# Patient Record
Sex: Male | Born: 1965
Health system: Southern US, Community
[De-identification: ages and names within clinical notes are randomized; demographics above are authoritative.]

## PROBLEM LIST (undated history)

## (undated) DIAGNOSIS — M549 Dorsalgia, unspecified: Secondary | ICD-10-CM

## (undated) DIAGNOSIS — I10 Essential (primary) hypertension: Secondary | ICD-10-CM

## (undated) DIAGNOSIS — M1610 Unilateral primary osteoarthritis, unspecified hip: Secondary | ICD-10-CM

## (undated) DIAGNOSIS — I509 Heart failure, unspecified: Secondary | ICD-10-CM

## (undated) HISTORY — PX: APPENDECTOMY: SHX54

## (undated) HISTORY — DX: Dorsalgia, unspecified: M54.9

## (undated) HISTORY — DX: Unilateral primary osteoarthritis, unspecified hip: M16.10

---

## 2015-10-04 DIAGNOSIS — F419 Anxiety disorder, unspecified: Secondary | ICD-10-CM

## 2015-10-04 DIAGNOSIS — F5105 Insomnia due to other mental disorder: Principal | ICD-10-CM

## 2015-10-04 NOTE — Congregational Nurse Program (Unsigned)
Client came to see mental health CN for complaint of insomnia related to anxiety.  Client states, "My mom is in the hospital and I worry about her and I can't get to sleep."  Client denies suicidal ideation or plan.  Client has been out of prison for a year and has taken anger management classes.  Client has a health care provider [facility].  Client does not know the name of the provider.  Client given the address and phone number for clinic and address, also given phone number and address for [facility] to help with the grieving process.

## 2017-03-19 ENCOUNTER — Emergency Department (HOSPITAL_COMMUNITY)
Admission: EM | Admit: 2017-03-19 | Discharge: 2017-03-20 | Disposition: A | Discharge status: Home / Self Care | Payer: Self-pay | Attending: Emergency Medicine | Admitting: Emergency Medicine

## 2017-03-19 ENCOUNTER — Encounter (HOSPITAL_COMMUNITY): Payer: Self-pay | Admitting: *Deleted

## 2017-03-19 DIAGNOSIS — M549 Dorsalgia, unspecified: Secondary | ICD-10-CM | POA: Insufficient documentation

## 2017-03-19 DIAGNOSIS — I10 Essential (primary) hypertension: Secondary | ICD-10-CM | POA: Insufficient documentation

## 2017-03-19 DIAGNOSIS — R1032 Left lower quadrant pain: Secondary | ICD-10-CM | POA: Insufficient documentation

## 2017-03-19 HISTORY — DX: Essential (primary) hypertension: I10

## 2017-03-19 LAB — COMPREHENSIVE METABOLIC PANEL
ALT: 43 U/L (ref 17–63)
ANION GAP: 14 (ref 5–15)
AST: 46 U/L — ABNORMAL HIGH (ref 15–41)
Albumin: 4.4 g/dL (ref 3.5–5.0)
Alkaline Phosphatase: 79 U/L (ref 38–126)
BUN: 12 mg/dL (ref 6–20)
CALCIUM: 8.9 mg/dL (ref 8.9–10.3)
CHLORIDE: 103 mmol/L (ref 101–111)
CO2: 21 mmol/L — AB (ref 22–32)
Creatinine, Ser: 0.79 mg/dL (ref 0.61–1.24)
GFR calc non Af Amer: >60 mL/min (ref >60)
Glucose, Bld: 89 mg/dL (ref 65–99)
Potassium: 3.6 mmol/L (ref 3.5–5.1)
SODIUM: 138 mmol/L (ref 135–145)
Total Bilirubin: 1.1 mg/dL (ref 0.3–1.2)
Total Protein: 8.3 g/dL — ABNORMAL HIGH (ref 6.5–8.1)

## 2017-03-19 LAB — CBC
HCT: 44.6 % (ref 39.0–52.0)
HEMOGLOBIN: 14.4 g/dL (ref 13.0–17.0)
MCH: 24.9 pg — AB (ref 26.0–34.0)
MCHC: 32.3 g/dL (ref 30.0–36.0)
MCV: 77.2 fL — AB (ref 78.0–100.0)
Platelets: 183 10*3/uL (ref 150–400)
RBC: 5.78 MIL/uL (ref 4.22–5.81)
RDW: 13.1 % (ref 11.5–15.5)
WBC: 8.6 10*3/uL (ref 4.0–10.5)

## 2017-03-19 LAB — URINALYSIS, ROUTINE W REFLEX MICROSCOPIC
Bilirubin Urine: NEGATIVE
Glucose, UA: NEGATIVE mg/dL
HGB URINE DIPSTICK: NEGATIVE
Ketones, ur: NEGATIVE mg/dL
LEUKOCYTES UA: NEGATIVE
Nitrite: NEGATIVE
Protein, ur: NEGATIVE mg/dL
SPECIFIC GRAVITY, URINE: 1.004 — AB (ref 1.005–1.030)
pH: 5 (ref 5.0–8.0)

## 2017-03-19 LAB — LIPASE, BLOOD: LIPASE: 68 U/L — AB (ref 11–51)

## 2017-03-19 NOTE — ED Triage Notes (Signed)
Per EMS, pt complains of LLQ abdominal pain, radiating to bladder, increased urinary frequency. Pt is also having difficulty raising left leg. Pt denies n/v/d.   BP 170/110 HR 104 O2 96% CBG 94

## 2017-03-19 NOTE — ED Provider Notes (Signed)
[facility]-EMERGENCY DEPT Provider Note   CSN: [id number] Arrival date & time: 03/19/17  1311     History   Chief Complaint Chief Complaint  Patient presents with  . Abdominal Pain    HPI Jerry Jennings is a 51 y.o. male with history of hypertension and appendectomy presents for evaluation of left lower quadrant abdominal pain described as intermittent, sharp and "pulling" for the last 2-3 months. Pain begins at anterior/lateral left thigh and radiates into left groin and LLQ.  Pain is worse with walking, bending at the waist, lifting his left leg. States he has physically lift his left leg up with his hands when getting in and out of bed. Also reports midline low back pain, feels stiff. Describes pain in the left lower extremity (thigh) as burning and shooting into left groin. States he is unable to squat and lean forward and pick up boxes without pain. States flexion of left hip causes pain in anterior thigh, but further bending all the way up causes sharp pain to LLQ. No falls or previous injury to back. Reports riding his mountain bike daily to and from work for the last 2 years, stopped 3-4 months ago due to pain. He works at [employer]. Has tried Tylenol which provides mild relief.  No fevers, chills, nausea, vomiting, dysuria, hematuria, diarrhea, constipation, testicular pain, saddle anesthesia. No IV drug use. No history of sciatica.   HPI  Past Medical History:  Diagnosis Date  . Hypertension     There are no active problems to display for this patient.   Past Surgical History:  Procedure Laterality Date  . APPENDECTOMY         Home Medications    Prior to Admission medications   Medication Sig Start Date End Date Taking? Authorizing Provider  acetaminophen (TYLENOL) 500 MG tablet Take 500 mg by mouth every 6 (six) hours as needed for mild pain, moderate pain or headache.   Yes [provider]    Family History No  family history on file.  Social History Social History   Tobacco Use  . Smoking status: Never Smoker  . Smokeless tobacco: Never Used  Substance Use Topics  . Alcohol use: Yes    Frequency: Never  . Drug use: No     Allergies   Sulfa antibiotics   Review of Systems Review of Systems  Gastrointestinal: Positive for abdominal pain.  Musculoskeletal: Positive for arthralgias, back pain, gait problem and myalgias.  Neurological: Positive for numbness (intermittent).  All other systems reviewed and are negative.    Physical Exam Updated Vital Signs BP (!) 183/73 (BP Location: Left Arm)   Pulse 91   Temp 98.5 F (36.9 C) (Oral)   Resp 16   Ht 6\' 1"  (1.854 m)   Wt 124.7 kg (275 lb)   SpO2 98%   BMI 36.28 kg/m   Physical Exam  Constitutional: He appears well-developed and well-nourished. No distress.  HENT:  Head: Normocephalic and atraumatic.  Nose: Nose normal.  Eyes: EOM are normal.  Neck:  No midline cervical spine tenderness No cervical paraspinal muscular tenderness or increased tone Full AROM of cervical spine without pain or rigidity   Cardiovascular: Normal rate, S1 normal, S2 normal and normal heart sounds.  Pulses:      Radial pulses are 2+ on the right side, and 2+ on the left side.       Dorsalis pedis pulses are 2+ on the right side, and 2+  on the left side.  Pulmonary/Chest: Effort normal and breath sounds normal. He has no decreased breath sounds. He exhibits no tenderness.  Abdominal: Soft. Normal appearance and bowel sounds are normal. There is tenderness in the left lower quadrant.    Focal tenderness to LLQ and left groin. No obvious inguinal hernias or masses.   Genitourinary:  Genitourinary Comments: External genitalia normal without erythema, edema, tenderness or lesions.  No groin lymphadenopathy.  No meatus discharge.  Glans and shaft smooth without tenderness, lesions, masses or deformity. Scrotum without lesions or edema.  Non tender  testicles. Epididymis and spermatic cord without tenderness or masses, bilaterally.  Musculoskeletal: He exhibits tenderness.       Right shoulder: He exhibits tenderness.       Lumbar back: He exhibits tenderness and pain.  +Midline lumbar spine tenderness w/o step offs or crepitus. No bilateral lumbar muscular tenderness. +Pain with PROM of left hip in anterior groin/LLQ SI joints and sciatic notches non tender, bilaterally  +Left SLR causes pain in left anterior groin/upper leg Did not tolerate Pearlean BrownieFaber.   Neurological:  5/5 strength with flexion/extension of hip, knee and ankle, bilaterally.  Sensation to light touch intact in lower extremities including feet  Skin: Skin is warm and dry. Capillary refill takes less than 2 seconds.  Psychiatric: He has a normal mood and affect. His behavior is normal. Judgment and thought content normal.     ED Treatments / Results  Labs (all labs ordered are listed, but only abnormal results are displayed) Labs Reviewed  LIPASE, BLOOD - Abnormal; Notable for the following components:      Result Value   Lipase 68 (*)    All other components within normal limits  COMPREHENSIVE METABOLIC PANEL - Abnormal; Notable for the following components:   CO2 21 (*)    Total Protein 8.3 (*)    AST 46 (*)    All other components within normal limits  CBC - Abnormal; Notable for the following components:   MCV 77.2 (*)    MCH 24.9 (*)    All other components within normal limits  URINALYSIS, ROUTINE W REFLEX MICROSCOPIC - Abnormal; Notable for the following components:   Color, Urine STRAW (*)    Specific Gravity, Urine 1.004 (*)    All other components within normal limits    EKG  EKG Interpretation None       Radiology No results found.  Procedures Procedures (including critical care time)  Medications Ordered in ED Medications  iopamidol (ISOVUE-300) 61 % injection (not administered)  iopamidol (ISOVUE-300) 61 % injection 100 mL (100 mLs  Intravenous Contrast Given 03/20/17 0034)     Initial Impression / Assessment and Plan / ED Course  I have reviewed the triage vital signs and the nursing notes.  Pertinent labs & imaging results that were available during my care of the patient were reviewed by me and considered in my medical decision making (see chart for details).    51 yo male presents to ED for evaluation of LLQ pain. Tight, pulling, intermittent. Pain actually started in left anterior upper thigh and radiates to LLQ. Ongoing for 2-3 months. No trauma or falls. No h/o DVT/PE.   On exam, extremity is NVI. No LE edema. He has focal tenderness to LLQ and left groin. He has pain to left anterior thigh and groin with PROM of hip and flexion against resistance. No overlaying skin changes. No obvious inguinal hernia. He also has midline lumbar spine tenderness.  Unclear etiology of symptoms. Considering abdominal vs MSk vs pinched nerve. Less likely thrombus.   Final Clinical Impressions(s) / ED Diagnoses   Lab work unremarkable. He is hypertensive, but stable. He is in NAD. Will order CT A/P, hip and lumbar spine.  Discussed plan with supervising physician who is agreeable with plan. Pt will be handed off to oncoming EDPA Humes who will f/u with imaging and determine disposition.   [id number]: re-evaluated pt. He returned from CT. He is hungry and requesting food. Pain is tolerable, not requesting pain control. CT scans pending. Anticipate d/c if no emergent process on CT scans with pain control. Discussed plan with pt who is agreeable. He will be referred to appropriate outpatient provider based on CT results. He has no PCP.  Final diagnoses:  Back ache  Left lower quadrant pain  Left groin pain    ED Discharge Orders    None       Jerrell MylarGibbons, Somara Frymire J, PA-C 03/20/17 0056    Nira Connardama, Pedro Eduardo, MD 03/20/17 0130

## 2017-03-20 ENCOUNTER — Emergency Department (HOSPITAL_COMMUNITY): Payer: Self-pay

## 2017-03-20 ENCOUNTER — Other Ambulatory Visit: Payer: Self-pay

## 2017-03-20 MED ORDER — NAPROXEN 500 MG PO TABS: 500.0000 mg | ORAL_TABLET | Freq: Two times a day (BID) | ORAL | 0 refills | Status: DC | PRN

## 2017-03-20 MED ORDER — HYDROCHLOROTHIAZIDE 25 MG PO TABS: 25.0000 mg | ORAL_TABLET | Freq: Every day | ORAL | 0 refills | Status: DC

## 2017-03-20 MED ORDER — HYDROCHLOROTHIAZIDE 12.5 MG PO CAPS
25.0000 mg | ORAL_CAPSULE | Freq: Once | ORAL | Status: AC
  Administered 2017-03-20: 25 mg via ORAL
  Filled 2017-03-20: qty 2

## 2017-03-20 MED ORDER — ATENOLOL 50 MG PO TABS: 50.0000 mg | ORAL_TABLET | Freq: Every day | ORAL | 0 refills | Status: DC

## 2017-03-20 MED ORDER — IOPAMIDOL (ISOVUE-300) INJECTION 61%
INTRAVENOUS | Status: AC
  Filled 2017-03-20: qty 100

## 2017-03-20 MED ORDER — IOPAMIDOL (ISOVUE-300) INJECTION 61%
100.0000 mL | Freq: Once | INTRAVENOUS | Status: AC | PRN
  Administered 2017-03-20: 100 mL via INTRAVENOUS

## 2017-03-20 MED ORDER — ATENOLOL 50 MG PO TABS
50.0000 mg | ORAL_TABLET | Freq: Once | ORAL | Status: AC
  Administered 2017-03-20: 50 mg via ORAL
  Filled 2017-03-20: qty 1

## 2017-03-20 NOTE — ED Provider Notes (Signed)
3:00 AM Patient care assumed from Sharen Hecklaudia Gibbons, PA-C at change of shift.  Patient with imaging pending at shift change.  Plan discussed with Bradd CanaryGibbons, PA-C which includes discharge if imaging reassuring.  CTs have been reviewed.  Imaging notable for moderate to severe osteoarthritic change about the hips, left greater than right.  There is also degenerative disc changes at L3 through L5.  Patient with complaints of left thigh and groin pain.  This is most likely attributable to these osteoarthritic changes especially given that they are aggravated with ambulation, bending at the waist, lifting of the left leg.  Have discussed supportive management as well as orthopedic referral.  Patient agreeable to plan.  He expresses concern about hypertension which he has a history of.  He states that he has been off of his atenolol and hydrochlorothiazide for the past 4 months secondary to cost restriction.  These have been prescribed to him today and patient has been reassured that these medications [facility] $4 each for 1 month supply.  I believe the patient is stable for discharge and further follow-up on an outpatient basis.  Return precautions provided at discharge.  Patient agreeable to plan with no unaddressed concerns.   Results for orders placed or performed during the hospital encounter of 03/19/17  Lipase, blood  Result Value Ref Range   Lipase 68 (H) 11 - 51 U/L  Comprehensive metabolic panel  Result Value Ref Range   Sodium 138 135 - 145 mmol/L   Potassium 3.6 3.5 - 5.1 mmol/L   Chloride 103 101 - 111 mmol/L   CO2 21 (L) 22 - 32 mmol/L   Glucose, Bld 89 65 - 99 mg/dL   BUN 12 6 - 20 mg/dL   Creatinine, Ser 1.610.79 0.61 - 1.24 mg/dL   Calcium 8.9 8.9 - 09.610.3 mg/dL   Total Protein 8.3 (H) 6.5 - 8.1 g/dL   Albumin 4.4 3.5 - 5.0 g/dL   AST 46 (H) 15 - 41 U/L   ALT 43 17 - 63 U/L   Alkaline Phosphatase 79 38 - 126 U/L   Total Bilirubin 1.1 0.3 - 1.2 mg/dL   GFR calc non Af  Amer >60 >60 mL/min   GFR calc Af Amer >60 >60 mL/min   Anion gap 14 5 - 15  CBC  Result Value Ref Range   WBC 8.6 4.0 - 10.5 K/uL   RBC 5.78 4.22 - 5.81 MIL/uL   Hemoglobin 14.4 13.0 - 17.0 g/dL   HCT 04.544.6 40.939.0 - 81.152.0 %   MCV 77.2 (L) 78.0 - 100.0 fL   MCH 24.9 (L) 26.0 - 34.0 pg   MCHC 32.3 30.0 - 36.0 g/dL   RDW 91.413.1 78.211.5 - 95.615.5 %   Platelets 183 150 - 400 K/uL  Urinalysis, Routine w reflex microscopic  Result Value Ref Range   Color, Urine STRAW (A) YELLOW   APPearance CLEAR CLEAR   Specific Gravity, Urine 1.004 (L) 1.005 - 1.030   pH 5.0 5.0 - 8.0   Glucose, UA NEGATIVE NEGATIVE mg/dL   Hgb urine dipstick NEGATIVE NEGATIVE   Bilirubin Urine NEGATIVE NEGATIVE   Ketones, ur NEGATIVE NEGATIVE mg/dL   Protein, ur NEGATIVE NEGATIVE mg/dL   Nitrite NEGATIVE NEGATIVE   Leukocytes, UA NEGATIVE NEGATIVE   Ct Abdomen Pelvis W Contrast  Result Date: 03/20/2017 CLINICAL DATA:  Initial evaluation for acute left lower quadrant abdominal pain radiating to bladder, increased urinary frequency. Also with difficulty raising left leg. EXAM:  CT ABDOMEN AND PELVIS WITH CONTRAST CT LUMBAR SPINE WITHOUT CONTRAST TECHNIQUE: Multidetector CT imaging of the abdomen and pelvis was performed using the standard protocol following bolus administration of intravenous contrast. Additional re-formatted imaging of the lumbar spine was performed. CONTRAST:  ISOVUE-300 IOPAMIDOL (ISOVUE-300) INJECTION 61% COMPARISON:  None. FINDINGS: Lower chest: Mild hazy subsegmental atelectatic changes noted within the lung bases. Visualized lungs are otherwise clear. Hepatobiliary: Hepatic steatosis. Liver otherwise unremarkable. Gallbladder contracted. Scattered internal calcifications likely reflects small stones. No imaging findings to suggest acute cholecystitis. No biliary dilatation. Pancreas: Pancreas there are 2 adjacent ovoid soft tissue lesions measuring 3.7 cm and 3.3 cm positioned just superior to the  pancreatic body (series 2, image 28). These lesions are favored to arise from the pancreas inferiorly, although exact delineation somewhat difficult on this exam. These appear separate from knee stomach superiorly. Findings are indeterminate. Pancreas otherwise unremarkable. No peripancreatic inflammation or abnormal pancreatic ductal dilatation. Spleen: Spleen within normal limits. Adrenals/Urinary Tract: Adrenal glands are normal. Kidneys equal in size with symmetric enhancement. 2.5 cm cyst noted at the upper pole of the left kidney. No nephrolithiasis hydronephrosis, or focal enhancing renal mass. No hydroureter. Partially distended bladder within normal limits. Stomach/Bowel: Small hiatal hernia noted. Stomach otherwise unremarkable. No evidence for bowel obstruction. Appendix is normal. No acute inflammatory changes seen about the bowels. Vascular/Lymphatic: Normal intravascular enhancement seen throughout the intra-abdominal aorta. No aneurysm. Mesenteric vessels patent proximally. No adenopathy. Reproductive: Prostate normal. Other: Cysts no free air or fluid. Small fat containing paraumbilical hernia noted. Musculoskeletal: No acute osseus abnormality. No worrisome lytic or blastic osseous lesions. Severe left with moderate right osteoarthritic changes about the hips. CT LUMBAR SPINE FINDINGS: Normal segmentation. Lowest well-formed disc labeled the L5-S1 level. Trace anterolisthesis of L4 on L5. Vertebral bodies otherwise normally aligned with preservation of the normal lumbar lordosis. Vertebral body heights maintained without evidence for acute or chronic fracture. No discrete lytic or blastic osseous lesions. Visualized sacrum and pelvis are intact. Paraspinous soft tissues demonstrate no acute abnormality. 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 L1-2:  Mild facet hypertrophy.  No stenosis. L2-3: Mild diffuse disc bulge. Moderate facet  hypertrophy. No significant canal stenosis. Mild bilateral L2 foraminal narrowing. L3-4: Diffuse disc bulge. Moderate facet hypertrophy. Resultant mild to moderate spinal stenosis. Fairly severe bilateral L3 foraminal narrowing. L4-5: Diffuse disc bulge. Moderate facet arthrosis, right worse than left. Mild to moderate canal with bilateral lateral recess narrowing. Moderate to severe bilateral L4 foraminal stenosis. L5-S1: Diffuse disc bulge. Advanced facet arthrosis. Bulging disc closely approximates the descending S1 nerve roots without frank impingement. No significant spinal stenosis. Moderate right with moderate to severe left L5 foraminal narrowing. IMPRESSION: CT ABDOMEN AND PELVIS IMPRESSION: 1. No CT evidence for acute intra-abdominal or pelvic process. 2. Two adjacent well-circumscribed soft tissue lesions measuring up to 3.7 cm arising from the pancreatic body, indeterminate. Follow-up examination with dedicated MRI of the abdomen, with and without contrast, recommended for further characterization. 3. Cholelithiasis. 4. Hepatic steatosis. 5. Moderate to severe osteoarthritic changes about the hips, left greater than right. CT LUMBAR SPINE IMPRESSION: 1. No acute abnormality within the lumbar spine. 2. Degenerative disc bulging with facet arthropathy at L3-4 and L4-5 with resultant mild to moderate spinal stenosis. 3. Multifactorial degenerative changes with resultant moderate to severe bilateral foraminal stenosis at L3 through L5 as above. 4. 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 Electronically Signed   By: Rise Mu M.D.   On:  03/20/2017 01:51   Ct Hip Left Wo Contrast  Result Date: 03/20/2017 CLINICAL DATA:  Difficulty raising left leg. EXAM: CT OF THE LEFT HIP WITHOUT CONTRAST TECHNIQUE: Multidetector CT imaging of the left hip was performed according to the standard protocol. Multiplanar CT image reconstructions were also generated. COMPARISON:  Performed in conjunction  with CT of the abdomen and pelvis and CT of the lumbar spine, reported separately. FINDINGS: Bones/Joint/Cartilage Advanced osteoarthritis with near complete joint space loss and large subchondral cysts. Large acetabular and femoral head osteophytes, acetabular osteophytes are fragmented. No large hip joint effusion. No acute fracture. Ligaments Suboptimally assessed by CT. Muscles and Tendons No intramuscular collection or evidence of acute abnormality. Soft tissues Large body habitus.  No suspicious subcutaneous abnormality. IMPRESSION: Advanced osteoarthritis of the left hip with significant joint space loss, large subchondral cysts and osteophytes. No acute osseous abnormality. Electronically Signed   By: Rubye OaksMelanie  Ehinger M.D.   On: 03/20/2017 01:32   Ct L-spine No Charge  Result Date: 03/20/2017 CLINICAL DATA:  Initial evaluation for acute left lower quadrant abdominal pain radiating to bladder, increased urinary frequency. Also with difficulty raising left leg. EXAM: CT ABDOMEN AND PELVIS WITH CONTRAST CT LUMBAR SPINE WITHOUT CONTRAST TECHNIQUE: Multidetector CT imaging of the abdomen and pelvis was performed using the standard protocol following bolus administration of intravenous contrast. Additional re-formatted imaging of the lumbar spine was performed. CONTRAST:  100mL ISOVUE-300 IOPAMIDOL (ISOVUE-300) INJECTION 61% COMPARISON:  None. FINDINGS: Lower chest: Mild hazy subsegmental atelectatic changes noted within the lung bases. Visualized lungs are otherwise clear. Hepatobiliary: Hepatic steatosis. Liver otherwise unremarkable. Gallbladder contracted. Scattered internal calcifications likely reflects small stones. No imaging findings to suggest acute cholecystitis. No biliary dilatation. Pancreas: Pancreas there are 2 adjacent ovoid soft tissue lesions measuring 3.7 cm and 3.3 cm positioned just superior to the pancreatic body (series 2, image 28). These lesions are favored to arise from the pancreas  inferiorly, although exact delineation somewhat difficult on this exam. These appear separate from knee stomach superiorly. Findings are indeterminate. Pancreas otherwise unremarkable. No peripancreatic inflammation or abnormal pancreatic ductal dilatation. Spleen: Spleen within normal limits. Adrenals/Urinary Tract: Adrenal glands are normal. Kidneys equal in size with symmetric enhancement. 2.5 cm cyst noted at the upper pole of the left kidney. No nephrolithiasis hydronephrosis, or focal enhancing renal mass. No hydroureter. Partially distended bladder within normal limits. Stomach/Bowel: Small hiatal hernia noted. Stomach otherwise unremarkable. No evidence for bowel obstruction. Appendix is normal. No acute inflammatory changes seen about the bowels. Vascular/Lymphatic: Normal intravascular enhancement seen throughout the intra-abdominal aorta. No aneurysm. Mesenteric vessels patent proximally. No adenopathy. Reproductive: Prostate normal. Other: Cysts no free air or fluid. Small fat containing paraumbilical hernia noted. Musculoskeletal: No acute osseus abnormality. No worrisome lytic or blastic osseous lesions. Severe left with moderate right osteoarthritic changes about the hips. CT LUMBAR SPINE FINDINGS: Normal segmentation. Lowest well-formed disc labeled the L5-S1 level. Trace anterolisthesis of L4 on L5. Vertebral bodies otherwise normally aligned with preservation of the normal lumbar lordosis. Vertebral body heights maintained without evidence for acute or chronic fracture. No discrete lytic or blastic osseous lesions. Visualized sacrum and pelvis are intact. Paraspinous soft tissues demonstrate no acute abnormality. 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 L1-2:  Mild facet hypertrophy.  No stenosis. L2-3: Mild diffuse disc bulge. Moderate facet hypertrophy. No significant canal stenosis. Mild bilateral L2 foraminal narrowing. L3-4: Diffuse  disc bulge. Moderate facet hypertrophy. Resultant mild to moderate spinal stenosis. Fairly  severe bilateral L3 foraminal narrowing. L4-5: Diffuse disc bulge. Moderate facet arthrosis, right worse than left. Mild to moderate canal with bilateral lateral recess narrowing. Moderate to severe bilateral L4 foraminal stenosis. L5-S1: Diffuse disc bulge. Advanced facet arthrosis. Bulging disc closely approximates the descending S1 nerve roots without frank impingement. No significant spinal stenosis. Moderate right with moderate to severe left L5 foraminal narrowing. IMPRESSION: CT ABDOMEN AND PELVIS IMPRESSION: 1. No CT evidence for acute intra-abdominal or pelvic process. 2. Two adjacent well-circumscribed soft tissue lesions measuring up to 3.7 cm arising from the pancreatic body, indeterminate. Follow-up examination with dedicated MRI of the abdomen, with and without contrast, recommended for further characterization. 3. Cholelithiasis. 4. Hepatic steatosis. 5. Moderate to severe osteoarthritic changes about the hips, left greater than right. CT LUMBAR SPINE IMPRESSION: 1. No acute abnormality within the lumbar spine. 2. Degenerative disc bulging with facet arthropathy at L3-4 and L4-5 with resultant mild to moderate spinal stenosis. 3. Multifactorial degenerative changes with resultant moderate to severe bilateral foraminal stenosis at L3 through L5 as above. 4. 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 Electronically Signed   By: Rise Mu M.D.   On: 03/20/2017 01:51      Antony Madura, PA-C 03/20/17 1610    Azalia Bilis, MD 03/21/17 [phone]

## 2018-12-06 ENCOUNTER — Emergency Department (HOSPITAL_COMMUNITY)
Admission: EM | Admit: 2018-12-06 | Discharge: 2018-12-06 | Disposition: A | Discharge status: Home / Self Care | Payer: Self-pay | Attending: Emergency Medicine | Admitting: Emergency Medicine

## 2018-12-06 ENCOUNTER — Encounter (HOSPITAL_COMMUNITY): Payer: Self-pay | Admitting: Emergency Medicine

## 2018-12-06 ENCOUNTER — Emergency Department (HOSPITAL_COMMUNITY): Payer: Self-pay

## 2018-12-06 ENCOUNTER — Other Ambulatory Visit: Payer: Self-pay

## 2018-12-06 DIAGNOSIS — M1612 Unilateral primary osteoarthritis, left hip: Secondary | ICD-10-CM | POA: Insufficient documentation

## 2018-12-06 DIAGNOSIS — I10 Essential (primary) hypertension: Secondary | ICD-10-CM | POA: Insufficient documentation

## 2018-12-06 DIAGNOSIS — Z79899 Other long term (current) drug therapy: Secondary | ICD-10-CM | POA: Insufficient documentation

## 2018-12-06 MED ORDER — HYDROCODONE-ACETAMINOPHEN 5-325 MG PO TABS: 1.0000 | ORAL_TABLET | Freq: Four times a day (QID) | ORAL | 0 refills | Status: DC | PRN

## 2018-12-06 MED ORDER — ATENOLOL 50 MG PO TABS
50.0000 mg | ORAL_TABLET | Freq: Once | ORAL | Status: AC
  Administered 2018-12-06: 50 mg via ORAL
  Filled 2018-12-06: qty 1

## 2018-12-06 MED ORDER — HYDROCODONE-ACETAMINOPHEN 5-325 MG PO TABS
1.0000 | ORAL_TABLET | Freq: Once | ORAL | Status: AC
  Administered 2018-12-06: 1 via ORAL
  Filled 2018-12-06: qty 1

## 2018-12-06 MED ORDER — ATENOLOL 50 MG PO TABS: 50.0000 mg | ORAL_TABLET | Freq: Every day | ORAL | 0 refills | Status: DC

## 2018-12-06 MED ORDER — HYDROCODONE-ACETAMINOPHEN 5-325 MG PO TABS
1.0000 | ORAL_TABLET | ORAL | Status: AC
  Administered 2018-12-06: 1 via ORAL
  Filled 2018-12-06: qty 1

## 2018-12-06 NOTE — ED Triage Notes (Signed)
[place] [facility] [place] pains for months but got worse in the past 21 days. denies falls or injuries.  Vitals: 218/90 out of HTN meds

## 2018-12-06 NOTE — ED Provider Notes (Signed)
[facility] DEPT Provider Note   CSN: [id number] Arrival date & time: 12/06/18  1830     History   Chief Complaint Chief Complaint  Patient presents with  . Hip Pain    left    HPI Jerry Jennings is a 53 y.o. male.     HPI Patient presents to the emergency room for evaluation of left hip pain.  Patient states he started having pain in his left hip several months ago.  Over the last week or 2 the symptoms have been getting worse.  He finds it very painful to walk.  He needs to use a cane.  He finds that he is having difficulty lifting his leg.  He feels the pain shoots up into his hip.  He denies any back pain.  He denies any abdominal pain.  No numbness.  No difficulty urinating.  Patient also has history of hypertension but has not had any of his medications in months. Past Medical History:  Diagnosis Date  . Hypertension     There are no active problems to display for this patient.   Past Surgical History:  Procedure Laterality Date  . APPENDECTOMY          Home Medications    Prior to Admission medications   Medication Sig Start Date End Date Taking? Authorizing Provider  acetaminophen (TYLENOL) 500 MG tablet Take 500-1,000 mg by mouth every 6 (six) hours as needed.   Yes [provider]  hydrochlorothiazide (HYDRODIURIL) 25 MG tablet Take 1 tablet (25 mg total) by mouth daily. 03/20/17  Yes Antonietta Breach, PA-C  ibuprofen (ADVIL) 200 MG tablet Take 600-800 mg by mouth every 6 (six) hours as needed for mild pain or moderate pain.   Yes [provider]  atenolol (TENORMIN) 50 MG tablet Take 1 tablet (50 mg total) by mouth daily. 12/06/18   Dorie Rank, MD  HYDROcodone-acetaminophen (NORCO/VICODIN) 5-325 MG tablet Take 1 tablet by mouth every 6 (six) hours as needed. 12/06/18   Dorie Rank, MD  naproxen (NAPROSYN) 500 MG tablet Take 1 tablet (500 mg total) by mouth every 12 (twelve) hours as needed for mild pain or moderate pain.  Patient not taking: Reported on 12/06/2018 03/20/17   Antonietta Breach, PA-C    Family History No family history on file.  Social History Social History   Tobacco Use  . Smoking status: Never Smoker  . Smokeless tobacco: Never Used  Substance Use Topics  . Alcohol use: Yes    Frequency: Never  . Drug use: No     Allergies   Sulfa antibiotics   Review of Systems Review of Systems  All other systems reviewed and are negative.    Physical Exam Updated Vital Signs BP (!) 141/70   Pulse 71   Temp 98.7 F (37.1 C) (Oral)   Resp 15   SpO2 97%   Physical Exam Vitals signs and nursing note reviewed.  Constitutional:      General: He is not in acute distress.    Appearance: He is well-developed.     Comments: Obese  HENT:     Head: Normocephalic and atraumatic.     Right Ear: External ear normal.     Left Ear: External ear normal.  Eyes:     General: No scleral icterus.       Right eye: No discharge.        Left eye: No discharge.     Conjunctiva/sclera: Conjunctivae normal.  Neck:  Musculoskeletal: Neck supple.     Trachea: No tracheal deviation.  Cardiovascular:     Rate and Rhythm: Normal rate and regular rhythm.  Pulmonary:     Effort: Pulmonary effort is normal. No respiratory distress.     Breath sounds: Normal breath sounds. No stridor. No wheezing or rales.  Abdominal:     General: Bowel sounds are normal. There is no distension.     Palpations: Abdomen is soft.     Tenderness: There is no abdominal tenderness. There is no guarding or rebound.  Musculoskeletal:     Left hip: He exhibits tenderness and bony tenderness.     Lumbar back: He exhibits no bony tenderness and no swelling.     Comments: Tenderness palpation left hip region, pain with range of motion, patient has difficulty lifting his left leg off the bed, 5 out of 5 plantar flexion dorsiflexion strength, sensation normal  Skin:    General: Skin is warm and dry.     Findings: No rash.   Neurological:     Mental Status: He is alert.     Cranial Nerves: No cranial nerve deficit (no facial droop, extraocular movements intact, no slurred speech).     Sensory: No sensory deficit.     Motor: No abnormal muscle tone or seizure activity.     Coordination: Coordination normal.      ED Treatments / Results  Labs (all labs ordered are listed, but only abnormal results are displayed) Labs Reviewed - No data to display  EKG None  Radiology Dg Lumbar Spine Complete  Result Date: 12/06/2018 CLINICAL DATA:  Pain EXAM: LUMBAR SPINE - COMPLETE 4+ VIEW COMPARISON:  CT dated March 20, 2017. FINDINGS: There is multilevel disc height loss throughout the lumbar spine. There is multilevel facet arthrosis, greatest at the lower lumbar segments. There is no displaced fracture. No dislocation. End-stage degenerative changes of the left hip are noted. IMPRESSION: 1. No displaced fracture. 2. Multilevel degenerative changes are noted throughout the lumbar spine. 3. End-stage degenerative changes of the left hip. Electronically Signed   By: Katherine Mantlehristopher  Green M.D.   On: 12/06/2018 21:54   Dg Hip Unilat W Or Wo Pelvis 2-3 Views Left  Result Date: 12/06/2018 CLINICAL DATA:  Hip pain EXAM: DG HIP (WITH OR WITHOUT PELVIS) 2-3V LEFT COMPARISON:  CT dated March 20, 2017. FINDINGS: There is no acute displaced fracture or dislocation. There are end-stage degenerative changes of the left hip. There are moderate degenerative changes of the right hip. IMPRESSION: 1. No acute displaced fracture or dislocation. 2. End-stage degenerative changes of the left hip, significantly progressed since 2018 CT. Electronically Signed   By: Katherine Mantlehristopher  Green M.D.   On: 12/06/2018 21:52    Procedures Procedures (including critical care time)  Medications Ordered in ED Medications  HYDROcodone-acetaminophen (NORCO/VICODIN) 5-325 MG per tablet 1 tablet (1 tablet Oral Given 12/06/18 2145)  atenolol (TENORMIN) tablet 50  mg (50 mg Oral Given 12/06/18 2145)     Initial Impression / Assessment and Plan / ED Course  I have reviewed the triage vital signs and the nursing notes.  Pertinent labs & imaging results that were available during my care of the patient were reviewed by me and considered in my medical decision making (see chart for details).   Patient's x-rays show severe arthritis of his left hip.  He does have degenerative changes in his lumbar spine but I think his pain originates from his hip osteoarthritis.  Patient was given a  dose of pain medications.  He will need to follow-up with an orthopedic doctor.  Explained to him he likely will require hip replacement in order to adequately treat his pain.  I will also give him prescriptions of his antihypertension agents.  Final Clinical Impressions(s) / ED Diagnoses   Final diagnoses:  Osteoarthritis of left hip, unspecified osteoarthritis type    ED Discharge Orders         Ordered    HYDROcodone-acetaminophen (NORCO/VICODIN) 5-325 MG tablet  Every 6 hours PRN     12/06/18 2316    atenolol (TENORMIN) 50 MG tablet  Daily     12/06/18 2317           Linwood Dibbles, MD 12/06/18 2318

## 2018-12-06 NOTE — Discharge Instructions (Signed)
Take the medications as prescribed for pain, follow-up with your orthopedic doctor for further evaluation

## 2018-12-21 ENCOUNTER — Ambulatory Visit: Payer: Self-pay | Admitting: Orthopaedic Surgery

## 2019-02-08 ENCOUNTER — Ambulatory Visit (INDEPENDENT_AMBULATORY_CARE_PROVIDER_SITE_OTHER): Payer: Self-pay | Admitting: Orthopaedic Surgery

## 2019-02-08 ENCOUNTER — Other Ambulatory Visit: Payer: Self-pay

## 2019-02-08 ENCOUNTER — Encounter: Payer: Self-pay | Admitting: Orthopaedic Surgery

## 2019-02-08 VITALS — BP 150/95 | HR 85 | Ht 73.0 in | Wt 310.0 lb

## 2019-02-08 DIAGNOSIS — Z6841 Body Mass Index (BMI) 40.0 and over, adult: Secondary | ICD-10-CM

## 2019-02-08 DIAGNOSIS — M1612 Unilateral primary osteoarthritis, left hip: Secondary | ICD-10-CM

## 2019-02-08 NOTE — Progress Notes (Signed)
Office Visit Note   Patient: Jerry Jennings           Date of Birth: 1965/12/13           MRN: [id number] Visit Date: 02/08/2019              Requested by: Lavinia SharpsPlacey, Mary Ann, NP [address] [place],  [place] [id number] PCP: Lavinia SharpsPlacey, Mary Ann, NP   Assessment & Plan: Visit Diagnoses:  1. Body mass index 40.0-44.9, adult (HCC)   2. Unilateral primary osteoarthritis, left hip     Plan: Patient needs to work on weight loss get his weight down to BMI less than 40 which is 303 pounds at 6 feet 1 inch height.  I will recheck him in 4 weeks we can obtain arthritis panel on return.  He can continue to work at [employer] at [employer].  We discussed once he gets his weight under control we will schedule for total hip arthroplasty direct anterior approach.  We discussed increased risks for complications with increased BMI.  These include infection falling.  Patient negative history for diabetes. The patient meets the AMA guidelines for Morbid (severe) obesity with a BMI > 40.0 and I have recommended weight loss.  Follow-Up Instructions: Return in about 4 weeks (around 03/08/2019).   Orders:  No orders of the defined types were placed in this encounter.  No orders of the defined types were placed in this encounter.     Procedures: No procedures performed   Clinical Data: No additional findings.   Subjective: Chief Complaint  Patient presents with   Left Hip - Pain   Left Leg - Pain    HPI 53 year old male with left hip pain for more than 4 years progressed over the last 8 months and severe in the last 1 month.  He has worked at [employer] doing [employer] work and now has had to transition just to [employer] since he not able to ambulate and is using 2 canes and cannot ambulate without them.  He is lost range of motion of his left hip and now has severe pain with activity great problems getting into a car pain radiates from his left groin down to his left  knee no right hip pain.  He states he has had some problems with sciatica in the past as well.  X-rays emergency room September 2020 showed end-stage severe left hip osteoarthritis with preserved right hip without arthritis.  Lumbar spine x-ray showed some degenerative changes.  Previous CT scan lumbar 03/20/2017 showed some spurring and some mild narrowing with facet arthropathy L3-L5 with some suspicion of some underlying congenital spinal stenosis.  Patient's hip pain is so severe he can barely walk across the room and cannot ambulate enough to know whether he has claudication symptoms.  No associated bowel or bladder symptoms.  Patient's not sure if he is ever had gout before.  Negative history of podagra.  Patient has obesity BMI greater than 40 height 6 feet 1 weight 310.  He states at one point he was 40 to 50 pounds heavier.  Patient is used Tylenol ibuprofen, Naprosyn, Norco, Percocet and states nothing helps the pain.  Patient needs to establish a PCP for evaluation of his overall health.  Blood pressure is mildly elevated at 150/95 today.  Recheck 4 weeks and arthritis panel on return.  Review of Systems positive for severe left hip osteoarthritis.  Negative for diabetes negative for stroke or MI.  Positive  for morbid obesity BMI 40.9.  Cholelithiasis and hepatic steatosis by CT scan abdomen.  Otherwise negative is obtains HPI.   Objective: Vital Signs: BP (!) 150/95    Pulse 85    Ht 6\' 1"  (1.854 m)    Wt (!) 310 lb (140.6 kg)    BMI 40.90 kg/m   Physical Exam Constitutional:      Appearance: He is well-developed.  HENT:     Head: Normocephalic and atraumatic.  Eyes:     Pupils: Pupils are equal, round, and reactive to light.  Neck:     Thyroid: No thyromegaly.     Trachea: No tracheal deviation.  Cardiovascular:     Rate and Rhythm: Normal rate.  Pulmonary:     Effort: Pulmonary effort is normal.     Breath sounds: No wheezing.  Abdominal:     General: Bowel sounds are normal.       Palpations: Abdomen is soft.  Skin:    General: Skin is warm and dry.     Capillary Refill: Capillary refill takes less than 2 seconds.  Neurological:     Mental Status: He is alert and oriented to person, place, and time.  Psychiatric:        Behavior: Behavior normal.        Thought Content: Thought content normal.        Judgment: Judgment normal.     Ortho Exam patient has less than 5 degrees internal and external rotation left hip hip flexion contracture he can not sit and chair with his hip at 90 degrees due to pain.  Good capillary refill palpable posterior tib pulse knee reaches full extension.  Opposite right hip has good hip range of motion.  Specialty Comments:  No specialty comments available.  Imaging: CLINICAL DATA:  Hip pain  EXAM: DG HIP (WITH OR WITHOUT PELVIS) 2-3V LEFT  COMPARISON:  CT dated March 20, 2017.  FINDINGS: There is no acute displaced fracture or dislocation. There are end-stage degenerative changes of the left hip. There are moderate degenerative changes of the right hip.  IMPRESSION: 1. No acute displaced fracture or dislocation. 2. End-stage degenerative changes of the left hip, significantly progressed since 2018 CT.   Electronically Signed   By: 2019 M.D.   On: 12/06/2018 21:52 CLINICAL DATA:  Pain  EXAM: LUMBAR SPINE - COMPLETE 4+ VIEW  COMPARISON:  CT dated March 20, 2017.  FINDINGS: There is multilevel disc height loss throughout the lumbar spine. There is multilevel facet arthrosis, greatest at the lower lumbar segments. There is no displaced fracture. No dislocation. End-stage degenerative changes of the left hip are noted.  IMPRESSION: 1. No displaced fracture. 2. Multilevel degenerative changes are noted throughout the lumbar spine. 3. End-stage degenerative changes of the left hip.   Electronically Signed   By: March 22, 2017 M.D.   On: 12/06/2018 21:54 CLINICAL DATA:   Initial evaluation for acute left lower quadrant abdominal pain radiating to bladder, increased urinary frequency. Also with difficulty raising left leg.  EXAM: CT ABDOMEN AND PELVIS WITH CONTRAST  CT LUMBAR SPINE WITHOUT CONTRAST  TECHNIQUE: Multidetector CT imaging of the abdomen and pelvis was performed using the standard protocol following bolus administration of intravenous contrast. Additional re-formatted imaging of the lumbar spine was performed.  CONTRAST:  02/05/2019 ISOVUE-300 IOPAMIDOL (ISOVUE-300) INJECTION 61%  COMPARISON:  None.  FINDINGS: Lower chest: Mild hazy subsegmental atelectatic changes noted within the lung bases. Visualized lungs are otherwise clear.  Hepatobiliary: Hepatic  steatosis. Liver otherwise unremarkable. Gallbladder contracted. Scattered internal calcifications likely reflects small stones. No imaging findings to suggest acute cholecystitis. No biliary dilatation.  Pancreas: Pancreas there are 2 adjacent ovoid soft tissue lesions measuring 3.7 cm and 3.3 cm positioned just superior to the pancreatic body (series 2, image 28). These lesions are favored to arise from the pancreas inferiorly, although exact delineation somewhat difficult on this exam. These appear separate from knee stomach superiorly. Findings are indeterminate. Pancreas otherwise unremarkable. No peripancreatic inflammation or abnormal pancreatic ductal dilatation.  Spleen: Spleen within normal limits.  Adrenals/Urinary Tract: Adrenal glands are normal. Kidneys equal in size with symmetric enhancement. 2.5 cm cyst noted at the upper pole of the left kidney. No nephrolithiasis hydronephrosis, or focal enhancing renal mass. No hydroureter. Partially distended bladder within normal limits.  Stomach/Bowel: Small hiatal hernia noted. Stomach otherwise unremarkable. No evidence for bowel obstruction. Appendix is normal. No acute inflammatory changes seen about the  bowels.  Vascular/Lymphatic: Normal intravascular enhancement seen throughout the intra-abdominal aorta. No aneurysm. Mesenteric vessels patent proximally. No adenopathy.  Reproductive: Prostate normal.  Other: Cysts no free air or fluid. Small fat containing paraumbilical hernia noted.  Musculoskeletal: No acute osseus abnormality. No worrisome lytic or blastic osseous lesions. Severe left with moderate right osteoarthritic changes about the hips.  CT LUMBAR SPINE FINDINGS:  Normal segmentation. Lowest well-formed disc labeled the L5-S1 level.  Trace anterolisthesis of L4 on L5. Vertebral bodies otherwise normally aligned with preservation of the normal lumbar lordosis.  Vertebral body heights maintained without evidence for acute or chronic fracture. No discrete lytic or blastic osseous lesions. Visualized sacrum and pelvis are intact.  Paraspinous soft tissues demonstrate no acute abnormality.  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  L1-2:  Mild facet hypertrophy.  No stenosis.  L2-3: Mild diffuse disc bulge. Moderate facet hypertrophy. No significant canal stenosis. Mild bilateral L2 foraminal narrowing.  L3-4: Diffuse disc bulge. Moderate facet hypertrophy. Resultant mild to moderate spinal stenosis. Fairly severe bilateral L3 foraminal narrowing.  L4-5: Diffuse disc bulge. Moderate facet arthrosis, right worse than left. Mild to moderate canal with bilateral lateral recess narrowing. Moderate to severe bilateral L4 foraminal stenosis.  L5-S1: Diffuse disc bulge. Advanced facet arthrosis. Bulging disc closely approximates the descending S1 nerve roots without frank impingement. No significant spinal stenosis. Moderate right with moderate to severe left L5 foraminal narrowing.  IMPRESSION: CT ABDOMEN AND PELVIS IMPRESSION:  1. No CT evidence for acute intra-abdominal or pelvic  process. 2. Two adjacent well-circumscribed soft tissue lesions measuring up to 3.7 cm arising from the pancreatic body, indeterminate. Follow-up examination with dedicated MRI of the abdomen, with and without contrast, recommended for further characterization. 3. Cholelithiasis. 4. Hepatic steatosis. 5. Moderate to severe osteoarthritic changes about the hips, left greater than right. CT LUMBAR SPINE IMPRESSION:  1. No acute abnormality within the lumbar spine. 2. Degenerative disc bulging with facet arthropathy at L3-4 and L4-5 with resultant mild to moderate spinal stenosis. 3. Multifactorial degenerative changes with resultant moderate to severe bilateral foraminal stenosis at L3 through L5 as above. 4. 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   Electronically Signed   By: Rise Mu M.D.   On: 03/20/2017 01:51  PMFS History: Patient Active Problem List   Diagnosis Date Noted   Unilateral primary osteoarthritis, left hip 02/09/2019   Past Medical History:  Diagnosis Date   Hypertension     No family history on file.  Past Surgical History:  Procedure  Laterality Date   APPENDECTOMY     Social History   Occupational History   Not on file  Tobacco Use   Smoking status: Never Smoker   Smokeless tobacco: Never Used  Substance and Sexual Activity   Alcohol use: Yes    Frequency: Never   Drug use: No   Sexual activity: Not on file

## 2019-02-09 DIAGNOSIS — M1612 Unilateral primary osteoarthritis, left hip: Secondary | ICD-10-CM | POA: Insufficient documentation

## 2019-03-08 ENCOUNTER — Ambulatory Visit: Payer: Self-pay | Admitting: Orthopaedic Surgery

## 2019-03-10 ENCOUNTER — Ambulatory Visit: Payer: Self-pay | Admitting: Orthopaedic Surgery

## 2019-03-28 ENCOUNTER — Ambulatory Visit: Payer: Self-pay | Admitting: Orthopaedic Surgery

## 2019-04-24 ENCOUNTER — Encounter (HOSPITAL_COMMUNITY): Payer: Self-pay

## 2019-04-24 ENCOUNTER — Ambulatory Visit (HOSPITAL_COMMUNITY)
Admission: EM | Admit: 2019-04-24 | Discharge: 2019-04-24 | Disposition: A | Discharge status: Home / Self Care | Payer: 59 | Attending: Family Medicine | Admitting: Family Medicine

## 2019-04-24 ENCOUNTER — Other Ambulatory Visit: Payer: Self-pay

## 2019-04-24 DIAGNOSIS — R600 Localized edema: Secondary | ICD-10-CM | POA: Diagnosis present

## 2019-04-24 LAB — COMPREHENSIVE METABOLIC PANEL
ALT: 19 U/L (ref 0–44)
AST: 24 U/L (ref 15–41)
Albumin: 3.6 g/dL (ref 3.5–5.0)
Alkaline Phosphatase: 61 U/L (ref 38–126)
Anion gap: 12 (ref 5–15)
BUN: 16 mg/dL (ref 6–20)
CO2: 29 mmol/L (ref 22–32)
Calcium: 9.1 mg/dL (ref 8.9–10.3)
Chloride: 101 mmol/L (ref 98–111)
Creatinine, Ser: 0.76 mg/dL (ref 0.61–1.24)
GFR calc Af Amer: >60 mL/min (ref >60)
GFR calc non Af Amer: >60 mL/min (ref >60)
Glucose, Bld: 97 mg/dL (ref 70–99)
Potassium: 3.3 mmol/L — ABNORMAL LOW (ref 3.5–5.1)
Sodium: 142 mmol/L (ref 135–145)
Total Bilirubin: 0.7 mg/dL (ref 0.3–1.2)
Total Protein: 7.5 g/dL (ref 6.5–8.1)

## 2019-04-24 LAB — CBC
HCT: 45.4 % (ref 39.0–52.0)
Hemoglobin: 13.5 g/dL (ref 13.0–17.0)
MCH: 23.4 pg — ABNORMAL LOW (ref 26.0–34.0)
MCHC: 29.7 g/dL — ABNORMAL LOW (ref 30.0–36.0)
MCV: 78.8 fL — ABNORMAL LOW (ref 80.0–100.0)
Platelets: 237 10*3/uL (ref 150–400)
RBC: 5.76 MIL/uL (ref 4.22–5.81)
RDW: 12.8 % (ref 11.5–15.5)
WBC: 7.7 10*3/uL (ref 4.0–10.5)
nRBC: 0 % (ref 0.0–0.2)

## 2019-04-24 MED ORDER — FUROSEMIDE 40 MG PO TABS: 40.0000 mg | ORAL_TABLET | Freq: Every day | ORAL | 0 refills | Status: DC

## 2019-04-24 MED ORDER — POTASSIUM CHLORIDE ER 10 MEQ PO TBCR: 10.0000 meq | EXTENDED_RELEASE_TABLET | Freq: Every day | ORAL | 0 refills | Status: DC

## 2019-04-24 NOTE — ED Notes (Signed)
I walked pt from room 10 to the employee entrance door and then to the front corner of the building where he stated he was going to wait for [facility] to pick him up.  His pickup time is [id number].  Pt stated he was fine to wait out there.  I gave pt my phone number in case he needed any further assistance while waiting for the [facility].  Pt was poorly ambulating with two canes. He denies pain with ambulation but he is scared his left sciatic nerve will be aggravated and that it feels like he is having a heart attack when the pain hits him in that hip.  Pt unable to sit in a wheelchair for ease of transportation to the front of the building.  He has no other means of getting from one place to the next except with the two canes.

## 2019-04-24 NOTE — ED Triage Notes (Signed)
Pt presents with bilateral leg pain and swelling that makes mobility and sitting nearly impossible X 2 days.

## 2019-04-24 NOTE — Discharge Instructions (Addendum)
We are checking some basic lab work.  If this lab work is normal we will start you on Lasix for the next 5 days. I will call the medicine into the pharmacy if your lab work is normal.  [facility] on [address] need to make sure you are elevating your legs Wear some compression stockings I will give you a work note you can rest. Hopefully you can get your surgery soon so a lot of your problems will improve.

## 2019-04-24 NOTE — ED Provider Notes (Signed)
[facility]    CSN: [id number] Arrival date & time: 04/24/19  4235      History   Chief Complaint Chief Complaint  Patient presents with  . Appointment  . (10:30 Bilateral Leg Pain & Swelling)    HPI [facility] is a 54 y.o. male.   Patient is a 54 year old male with past medical history of hypertension and osteoarthritis of left hip.  He presents today with worsening bilateral lower extremity edema over the past week.  He takes hydrochlorothiazide daily.  Reporting urine has been mildly darker in color.  Patient works daily at [employer] and stands up for his job.  Reports he does not get to elevate his leg as much as he would like.  Wears compression stockings but these did not seem to help.  Swelling is worse to the right leg.  No calf pain or tenderness. No chest pain, SOB. Unsure if he has gained any weight. No recent long distance travels.   ROS per HPI      Past Medical History:  Diagnosis Date  . Hypertension     Patient Active Problem List   Diagnosis Date Noted  . Unilateral primary osteoarthritis, left hip 02/09/2019    Past Surgical History:  Procedure Laterality Date  . APPENDECTOMY         Home Medications    Prior to Admission medications   Medication Sig Start Date End Date Taking? Authorizing Provider  acetaminophen (TYLENOL) 500 MG tablet Take 500-1,000 mg by mouth every 6 (six) hours as needed.    [provider]  atenolol (TENORMIN) 50 MG tablet Take 1 tablet (50 mg total) by mouth daily. 12/06/18   Linwood Dibbles, MD  furosemide (LASIX) 40 MG tablet Take 1 tablet (40 mg total) by mouth daily for 5 days. 04/24/19 04/29/19  Dahlia Byes A, NP  hydrochlorothiazide (HYDRODIURIL) 25 MG tablet Take 1 tablet (25 mg total) by mouth daily. 03/20/17   Antony Madura, PA-C  ibuprofen (ADVIL) 200 MG tablet Take 600-800 mg by mouth every 6 (six) hours as needed for mild pain or moderate pain.    [provider]  potassium chloride  (KLOR-CON) 10 MEQ tablet Take 1 tablet (10 mEq total) by mouth daily. 04/24/19   Janace Aris, NP    Family History Family History  Problem Relation Age of Onset  . Heart attack Father     Social History Social History   Tobacco Use  . Smoking status: Never Smoker  . Smokeless tobacco: Never Used  Substance Use Topics  . Alcohol use: Yes  . Drug use: No     Allergies   Sulfa antibiotics   Review of Systems Review of Systems   Physical Exam Triage Vital Signs ED Triage Vitals  Enc Vitals Group     BP 04/24/19 1024 (!) 149/60     Pulse Rate 04/24/19 1024 88     Resp 04/24/19 1024 (!) 22     Temp 04/24/19 1024 98.9 F (37.2 C)     Temp Source 04/24/19 1024 Oral     SpO2 04/24/19 1024 99 %     Weight --      Height --      Head Circumference --      Peak Flow --      Pain Score 04/24/19 1022 9     Pain Loc --      Pain Edu? --      Excl. in GC? --  No data found.  Updated Vital Signs BP (!) 149/60 (BP Location: Left Arm)   Pulse 88   Temp 98.9 F (37.2 C) (Oral)   Resp (!) 22   SpO2 99%   Visual Acuity Right Eye Distance:   Left Eye Distance:   Bilateral Distance:    Right Eye Near:   Left Eye Near:    Bilateral Near:     Physical Exam Vitals and nursing note reviewed.  Constitutional:      General: He is not in acute distress.    Appearance: Normal appearance. He is obese. He is not ill-appearing, toxic-appearing or diaphoretic.  HENT:     Head: Normocephalic and atraumatic.     Nose: Nose normal.  Eyes:     Conjunctiva/sclera: Conjunctivae normal.  Cardiovascular:     Rate and Rhythm: Normal rate and regular rhythm.  Pulmonary:     Effort: Pulmonary effort is normal.     Breath sounds: Normal breath sounds.  Abdominal:     Palpations: Abdomen is soft.     Tenderness: There is no abdominal tenderness.  Musculoskeletal:        General: Normal range of motion.     Cervical back: Normal range of motion.     Right lower leg: Edema  present.     Left lower leg: Edema present.     Comments: 2+ pitting edema to BLE No calf tenderness, increase warmth.  Dry skin   Skin:    General: Skin is warm and dry.  Neurological:     Mental Status: He is alert.  Psychiatric:        Mood and Affect: Mood normal.      UC Treatments / Results  Labs (all labs ordered are listed, but only abnormal results are displayed) Labs Reviewed  CBC - Abnormal; Notable for the following components:      Result Value   MCV 78.8 (*)    MCH 23.4 (*)    MCHC 29.7 (*)    All other components within normal limits  COMPREHENSIVE METABOLIC PANEL - Abnormal; Notable for the following components:   Potassium 3.3 (*)    All other components within normal limits    EKG   Radiology No results found.  Procedures Procedures (including critical care time)  Medications Ordered in UC Medications - No data to display  Initial Impression / Assessment and Plan / UC Course  I have reviewed the triage vital signs and the nursing notes.  Pertinent labs & imaging results that were available during my care of the patient were reviewed by me and considered in my medical decision making (see chart for details).     BLE edema- 2+ on exam Not  Concerned  for DVT at this time.  Most likely edema is from prolonged standing at work and not elevating the legs.  We will check some basic lab work to check kidney function.  If this is normal we will start on lasix.  Stop HCTZ during this time.  Follow up as needed for continued or worsening symptoms  Patient called and made aware of all results. We will go forward with the Lasix x5 days. Reiterated with patient multiple times to not take the hydrochlorothiazide at this time. Potassium is slightly low.  We will have him take 10 of potassium daily while taking the Lasix. Recommend follow-up with his primary care as needed Strictreturn precautions given    Final Clinical Impressions(s) / UC  Diagnoses  Final diagnoses:  Bilateral leg edema     Discharge Instructions     We are checking some basic lab work.  If this lab work is normal we will start you on Lasix for the next 5 days. I will call the medicine into the pharmacy if your lab work is normal.  [facility] on [address] need to make sure you are elevating your legs Wear some compression stockings I will give you a work note you can rest. Hopefully you can get your surgery soon so a lot of your problems will improve.    ED Prescriptions    Medication Sig Dispense Auth. Provider   furosemide (LASIX) 40 MG tablet Take 1 tablet (40 mg total) by mouth daily for 5 days. 5 tablet Mckynna Vanloan A, NP   potassium chloride (KLOR-CON) 10 MEQ tablet Take 1 tablet (10 mEq total) by mouth daily. 5 tablet Loura Halt A, NP     PDMP not reviewed this encounter.   Orvan July, NP 04/24/19 1308

## 2019-05-03 ENCOUNTER — Ambulatory Visit (INDEPENDENT_AMBULATORY_CARE_PROVIDER_SITE_OTHER): Payer: 59 | Admitting: Orthopaedic Surgery

## 2019-05-03 ENCOUNTER — Emergency Department (HOSPITAL_COMMUNITY): Payer: 59

## 2019-05-03 ENCOUNTER — Other Ambulatory Visit: Payer: Self-pay

## 2019-05-03 ENCOUNTER — Encounter (HOSPITAL_COMMUNITY): Payer: Self-pay | Admitting: Emergency Medicine

## 2019-05-03 ENCOUNTER — Emergency Department (HOSPITAL_COMMUNITY)
Admission: EM | Admit: 2019-05-03 | Discharge: 2019-05-03 | Disposition: A | Discharge status: Home / Self Care | Payer: 59 | Attending: Emergency Medicine | Admitting: Emergency Medicine

## 2019-05-03 DIAGNOSIS — Z79899 Other long term (current) drug therapy: Secondary | ICD-10-CM | POA: Insufficient documentation

## 2019-05-03 DIAGNOSIS — I10 Essential (primary) hypertension: Secondary | ICD-10-CM | POA: Diagnosis not present

## 2019-05-03 DIAGNOSIS — E669 Obesity, unspecified: Secondary | ICD-10-CM | POA: Insufficient documentation

## 2019-05-03 DIAGNOSIS — M1612 Unilateral primary osteoarthritis, left hip: Secondary | ICD-10-CM

## 2019-05-03 DIAGNOSIS — Z6841 Body Mass Index (BMI) 40.0 and over, adult: Secondary | ICD-10-CM | POA: Diagnosis not present

## 2019-05-03 DIAGNOSIS — R600 Localized edema: Secondary | ICD-10-CM | POA: Diagnosis present

## 2019-05-03 LAB — BRAIN NATRIURETIC PEPTIDE: B Natriuretic Peptide: 45.5 pg/mL (ref 0.0–100.0)

## 2019-05-03 LAB — CBC WITH DIFFERENTIAL/PLATELET
Abs Immature Granulocytes: 0.03 10*3/uL (ref 0.00–0.07)
Basophils Absolute: 0 10*3/uL (ref 0.0–0.1)
Basophils Relative: 0 %
Eosinophils Absolute: 0.1 10*3/uL (ref 0.0–0.5)
Eosinophils Relative: 1 %
HCT: 45.5 % (ref 39.0–52.0)
Hemoglobin: 13.5 g/dL (ref 13.0–17.0)
Immature Granulocytes: 0 %
Lymphocytes Relative: 14 %
Lymphs Abs: 1.1 10*3/uL (ref 0.7–4.0)
MCH: 23.6 pg — ABNORMAL LOW (ref 26.0–34.0)
MCHC: 29.7 g/dL — ABNORMAL LOW (ref 30.0–36.0)
MCV: 79.4 fL — ABNORMAL LOW (ref 80.0–100.0)
Monocytes Absolute: 0.6 10*3/uL (ref 0.1–1.0)
Monocytes Relative: 8 %
Neutro Abs: 6.1 10*3/uL (ref 1.7–7.7)
Neutrophils Relative %: 77 %
Platelets: 220 10*3/uL (ref 150–400)
RBC: 5.73 MIL/uL (ref 4.22–5.81)
RDW: 12.8 % (ref 11.5–15.5)
WBC: 7.9 10*3/uL (ref 4.0–10.5)
nRBC: 0 % (ref 0.0–0.2)

## 2019-05-03 LAB — COMPREHENSIVE METABOLIC PANEL
ALT: 24 U/L (ref 0–44)
AST: 30 U/L (ref 15–41)
Albumin: 3.7 g/dL (ref 3.5–5.0)
Alkaline Phosphatase: 63 U/L (ref 38–126)
Anion gap: 13 (ref 5–15)
BUN: 15 mg/dL (ref 6–20)
CO2: 25 mmol/L (ref 22–32)
Calcium: 9.1 mg/dL (ref 8.9–10.3)
Chloride: 100 mmol/L (ref 98–111)
Creatinine, Ser: 0.78 mg/dL (ref 0.61–1.24)
GFR calc Af Amer: >60 mL/min (ref >60)
GFR calc non Af Amer: >60 mL/min (ref >60)
Glucose, Bld: 96 mg/dL (ref 70–99)
Potassium: 4.4 mmol/L (ref 3.5–5.1)
Sodium: 138 mmol/L (ref 135–145)
Total Bilirubin: 1 mg/dL (ref 0.3–1.2)
Total Protein: 7.7 g/dL (ref 6.5–8.1)

## 2019-05-03 MED ORDER — FUROSEMIDE 40 MG PO TABS: 40.0000 mg | ORAL_TABLET | Freq: Every day | ORAL | 0 refills | Status: DC

## 2019-05-03 NOTE — ED Notes (Signed)
The pt is tired of waiting to go home  He reports that he wants to go home to bed

## 2019-05-03 NOTE — ED Provider Notes (Signed)
[facility] Provider Note   CSN: [id number] Arrival date & time: 05/03/19  1159     History Chief Complaint  Patient presents with  . Leg Swelling    Jerry [facility] is a 54 y.o. male.  The history is provided by the patient and medical records. No language interpreter was used.     54 year old male with history of hypertension, osteoarthritis, brought here via EMS from home for evaluation of leg swelling.  Patient report for the past month he has noticed increased bilateral leg swelling and fluid retention.  He described his skin being stretched out from the swelling causing great pain and discomfort.  Pain is more of a tightness throbbing sensation throughout both lower extremities and occasional twinge sensation in his chest.  Endorse chronic hip osteoarthritis and sciatic nerve pain that further aggravate his symptoms.  Does not complain of any fever, productive cough, focal numbness or weakness, or saddle anesthesia.  No significant shortness of breath.  He was seen by urgent care center approximately 2 weeks ago for his symptoms.  Patient was given Lasix in which he took for 5 days with improvement however once he stopped Lasix patient noticed fluid retention returns and persist.  He denies any history of CHF.  Denies eating salty food or any change in his diet.  He admits to prolonged standing at work which further aggravates his swelling.  No prior history of PE or DVT.  No history of active cancer.  Does have history of high blood pressure and takes hydrochlorothiazide.  Past Medical History:  Diagnosis Date  . Hypertension     Patient Active Problem List   Diagnosis Date Noted  . Unilateral primary osteoarthritis, left hip 02/09/2019    Past Surgical History:  Procedure Laterality Date  . APPENDECTOMY         Family History  Problem Relation Age of Onset  . Heart attack Father     Social History   Tobacco Use  . Smoking  status: Never Smoker  . Smokeless tobacco: Never Used  Substance Use Topics  . Alcohol use: Yes  . Drug use: No    Home Medications Prior to Admission medications   Medication Sig Start Date End Date Taking? Authorizing Provider  acetaminophen (TYLENOL) 500 MG tablet Take 500-1,000 mg by mouth every 6 (six) hours as needed.    [provider]  atenolol (TENORMIN) 50 MG tablet Take 1 tablet (50 mg total) by mouth daily. 12/06/18   Linwood Dibbles, MD  furosemide (LASIX) 40 MG tablet Take 1 tablet (40 mg total) by mouth daily for 5 days. 04/24/19 04/29/19  Dahlia Byes A, NP  hydrochlorothiazide (HYDRODIURIL) 25 MG tablet Take 1 tablet (25 mg total) by mouth daily. 03/20/17   Antony Madura, PA-C  ibuprofen (ADVIL) 200 MG tablet Take 600-800 mg by mouth every 6 (six) hours as needed for mild pain or moderate pain.    [provider]  potassium chloride (KLOR-CON) 10 MEQ tablet Take 1 tablet (10 mEq total) by mouth daily. 04/24/19   Janace Aris, NP    Allergies    Sulfa antibiotics  Review of Systems   Review of Systems  All other systems reviewed and are negative.   Physical Exam Updated Vital Signs BP (!) 141/52   Pulse 83   Resp 12   Ht 6\' 1"  (1.854 m)   SpO2 99%   BMI 40.90 kg/m   Physical Exam Vitals and nursing note  reviewed.  Constitutional:      General: He is not in acute distress.    Appearance: He is well-developed. He is obese.     Comments: Obese male laying in bed appears to be in no acute discomfort.  HENT:     Head: Atraumatic.  Eyes:     Conjunctiva/sclera: Conjunctivae normal.  Cardiovascular:     Rate and Rhythm: Normal rate and regular rhythm.     Pulses: Normal pulses.     Heart sounds: Normal heart sounds.  Pulmonary:     Effort: Pulmonary effort is normal.     Breath sounds: Normal breath sounds.  Abdominal:     Palpations: Abdomen is soft.     Tenderness: There is no abdominal tenderness.  Musculoskeletal:        General: Swelling  present.     Cervical back: Neck supple.  Skin:    Findings: No rash.  Neurological:     Mental Status: He is alert.     ED Results / Procedures / Treatments   Labs (all labs ordered are listed, but only abnormal results are displayed) Labs Reviewed  CBC WITH DIFFERENTIAL/PLATELET - Abnormal; Notable for the following components:      Result Value   MCV 79.4 (*)    MCH 23.6 (*)    MCHC 29.7 (*)    All other components within normal limits  COMPREHENSIVE METABOLIC PANEL  BRAIN NATRIURETIC PEPTIDE    EKG EKG Interpretation  Date/Time:  Wednesday May 03 2019 12:21:05 EST Ventricular Rate:  87 PR Interval:    QRS Duration: 106 QT Interval:  388 QTC Calculation: 467 R Axis:   75 Text Interpretation: Sinus rhythm No previous ECGs available Confirmed by Ezequiel Essex [phone]) on 05/03/2019 12:26:38 PM   Radiology DG Chest Portable 1 View  Result Date: 05/03/2019 CLINICAL DATA:  Bilateral leg swelling.  Denies chest complaints. EXAM: PORTABLE CHEST 1 VIEW COMPARISON:  None. FINDINGS: The patient is rotated to the right, accentuating the right paratracheal stripe. The heart size and mediastinal contours are within normal limits. Both lungs are clear. The visualized skeletal structures are unremarkable. IMPRESSION: No active disease. Electronically Signed   By: Titus Dubin M.D.   On: 05/03/2019 13:46    Procedures Procedures (including critical care time)  Medications Ordered in ED Medications - No data to display  ED Course  I have reviewed the triage vital signs and the nursing notes.  Pertinent labs & imaging results that were available during my care of the patient were reviewed by me and considered in my medical decision making (see chart for details).    MDM Rules/Calculators/A&P                      BP (!) 141/52   Pulse 83   Resp 12   Ht 6\' 1"  (1.854 m)   SpO2 99%   BMI 40.90 kg/m   Final Clinical Impression(s) / ED Diagnoses Final diagnoses:    Bilateral lower extremity edema    Rx / DC Orders ED Discharge Orders         Ordered    furosemide (LASIX) 40 MG tablet  Daily     05/03/19 1421         1:56 PM This is a very obese patient with chronic bilateral leg swelling here with progressive worsening bilateral leg swelling causing discomfort.  He does have evidence of edema to his lower extremity, right greater than left however  his symptoms not suggestive of DVT.  No evidence to suggest infectious etiology.  Labs are reassuring, normal renal function, normal BNP, labs are normal.  Plan to place patient on Lasix and recommend outpatient follow-up for further management of his condition.   Fayrene Helper, PA-C 05/03/19 1423    Glynn Octave, MD 05/03/19 Mikle Bosworth

## 2019-05-03 NOTE — Discharge Instructions (Signed)
Take fluid pills as prescribed.  Keep your legs elevated at rest above your heart.  Wear compressive stocking or use ace wrap to wrap your legs to help with fluid gain.  Exercise daily.

## 2019-05-03 NOTE — ED Notes (Deleted)
[phone] daughter wants an update as soon as you can

## 2019-05-03 NOTE — ED Notes (Signed)
Pt given a urinal  He is the next person to be picked up by ptar

## 2019-05-03 NOTE — ED Triage Notes (Signed)
Pt was going to ortho appointment for sciatica across the street. Staff saw that pt was unable to get out of the car due to bilateral leg swelling. Pt went to [facility] 1/18 for same, was given lasix for 5 days, pt said it helped but prescription has run out. No hx of CHF. Pt said when he steps on the ball of his foot nerve pain shoots up to his chest. Denies SOB or CP right now

## 2019-05-03 NOTE — Progress Notes (Signed)
Came into the office today and could not make it to the exam room with severe swelling of his feet and chest pain diaphoretic.  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  He has no PCP.  He was placed on Lasix in the ER but has no primary care provider.  He has been trying to lose weight for his hip replacement but needs primary care follow-up evaluation of his heart and lungs and evaluation for his current acute chest pain and diaphoresis.  Hopefully the ER can arrange primary care follow-up for him and once he is medically more stable he can return to see me to proceed with left total hip arthroplasty.

## 2019-05-03 NOTE — ED Notes (Signed)
Patient contact Pam [phone]

## 2019-05-10 ENCOUNTER — Telehealth: Payer: Self-pay | Admitting: Orthopaedic Surgery

## 2019-05-10 NOTE — Telephone Encounter (Signed)
Please advise 

## 2019-05-10 NOTE — Telephone Encounter (Signed)
Patient called.   He was here last week but we had to call emergency transport as he was in so much pain .  He said Dr.Yates was mentioning something about surgery when he was here that day so he wanted to speak with him to further discuss it.   Call back number: [phone]

## 2019-05-11 NOTE — Telephone Encounter (Signed)
FYI,    I called discussed. Needs PCP established, needs weight down to 303. Once has PCP who has tuned him up and feels he is OK for THA and weight is below 303 then I told him I will schedule him for left THA. He will call today about getting new PCP since insurance change blocks him from seeing him previous PCP

## 2019-05-11 NOTE — Telephone Encounter (Signed)
noted 

## 2019-06-22 ENCOUNTER — Telehealth: Payer: Self-pay | Admitting: Radiology

## 2019-06-22 NOTE — Telephone Encounter (Signed)
Patient requests return call to discuss surgery. He has established PCP now and is being referred to a cardiologist for lower extremity swelling.

## 2019-06-23 DIAGNOSIS — R600 Localized edema: Secondary | ICD-10-CM | POA: Insufficient documentation

## 2019-06-23 DIAGNOSIS — I10 Essential (primary) hypertension: Secondary | ICD-10-CM | POA: Insufficient documentation

## 2019-06-23 NOTE — Progress Notes (Signed)
Patient referred by Lin Landsman, MD for leg edema  Subjective:   [facility], male    DOB: 1965/06/06, 54 y.o.   MRN: [id number]   Chief Complaint  Patient presents with  . Leg Swelling    HPI  54 y.o. African American male with hypertension, obesity, chronic hip pain, referred for evaluation of leg edema.   Patient has noticed right more than left leg edema for last 2 months.  Denies any exertional dyspnea, orthopnea, PND symptoms.  He has chest wall pain, only when he is using both crutches for walking.  He has had severe left hip pain and mobility limitation.  He is awaiting hip replacement surgery.  He previously used to be a Administrator.  He currently works at a [employer].  He denies any smoking, drinks only occasionally.  He is to consume a lot of NSAIDs for his hip pain, but has stopped them as they do not help anymore.  Is currently using Lasix 40 mg 1 tablet twice daily without any significant improvement of his leg edema.  Leg swelling improves in the morning, its worse throughout the day.  He is unable to wear compression stockings.   Past Medical History:  Diagnosis Date  . Hypertension      Past Surgical History:  Procedure Laterality Date  . APPENDECTOMY       Social History   Tobacco Use  Smoking Status Never Smoker  Smokeless Tobacco Never Used    Social History   Substance and Sexual Activity  Alcohol Use Not Currently     Family History  Problem Relation Age of Onset  . Heart attack Father   . Diabetes Father   . Cancer Mother   . Stroke Sister   . Hypertension Sister   . Prostate cancer Brother      Current Outpatient Medications on File Prior to Visit  Medication Sig Dispense Refill  . acetaminophen (TYLENOL) 500 MG tablet Take 1,000 mg by mouth every 6 (six) hours as needed (for pain).     Marland Kitchen atenolol (TENORMIN) 50 MG tablet Take 1 tablet (50 mg total) by mouth daily. 30 tablet 0  . furosemide (LASIX) 40 MG tablet Take 1  tablet (40 mg total) by mouth daily for 10 days. 10 tablet 0  . ibuprofen (ADVIL) 200 MG tablet Take 600-800 mg by mouth every 6 (six) hours as needed for mild pain or moderate pain.    . potassium chloride (KLOR-CON) 10 MEQ tablet Take 1 tablet (10 mEq total) by mouth daily. (Patient not taking: Reported on 05/03/2019) 5 tablet 0   No current facility-administered medications on file prior to visit.    Cardiovascular and other pertinent studies:  EKG 06/26/2019: Sinus rhythm 82 bpm. Nonspecific T wave inversion.    Recent labs: 05/03/2019: Glucose 96, BUN/Cr 15/0.78. EGFR 60. Na/K 138/4.40. H/H 13.5/45.5. MCV 79.4. Platelets 220    Review of Systems  Cardiovascular: Positive for leg swelling. Negative for chest pain, dyspnea on exertion, palpitations and syncope.  Musculoskeletal: Positive for joint pain.         Vitals:   06/26/19 1246  BP: (!) 158/95  Pulse: 85  Temp: (!) 97.2 F (36.2 C)  SpO2: 96%     Body mass index is 43.41 kg/m. Filed Weights   06/26/19 1246  Weight: (!) 329 lb (149.2 kg)     Objective:   Physical Exam  Constitutional: He appears well-developed and well-nourished.  Neck: No JVD  present.  Cardiovascular: Normal rate, regular rhythm, normal heart sounds and intact distal pulses.  No murmur heard. Pulmonary/Chest: Effort normal and breath sounds normal. He has no wheezes. He has no rales.  Musculoskeletal:        General: Edema (3+ b/l) present.  Nursing note and vitals reviewed.       Assessment & Recommendations:   54 y.o. African American male with hypertension, obesity, chronic hip pain, referred for evaluation of leg edema.   Leg edema: Severe bilateral leg edema.  Differentials include severe LV dysfunction, also need to consider other etiologies including renal or liver dysfunction.  Increase Lasix to 80 mg twice daily.  Will obtain echocardiogram.  I will see him back in 2 weeks.  If his symptoms do not improve, and if  echocardiogram shows significant LV dysfunction, he will likely need hospital admission for IV diuresis.  Further recommendations after above testing.   Thank you for referring the patient to [place]. Please feel free to contact with any questions.  Nigel Mormon, MD [facility] Pager: [phone] Office: [phone]

## 2019-06-23 NOTE — Telephone Encounter (Signed)
I called patient with no answer. Will try again.

## 2019-06-26 ENCOUNTER — Other Ambulatory Visit: Payer: Self-pay

## 2019-06-26 ENCOUNTER — Encounter: Payer: Self-pay | Admitting: Cardiology

## 2019-06-26 ENCOUNTER — Ambulatory Visit: Payer: 59 | Admitting: Cardiology

## 2019-06-26 VITALS — BP 157/89 | HR 81 | Temp 97.2°F | Ht 73.0 in | Wt 329.0 lb

## 2019-06-26 DIAGNOSIS — I10 Essential (primary) hypertension: Secondary | ICD-10-CM

## 2019-06-26 DIAGNOSIS — R600 Localized edema: Secondary | ICD-10-CM

## 2019-06-26 MED ORDER — POTASSIUM CHLORIDE CRYS ER 20 MEQ PO TBCR: 20.0000 meq | EXTENDED_RELEASE_TABLET | Freq: Two times a day (BID) | ORAL | 2 refills | Status: DC

## 2019-06-26 MED ORDER — FUROSEMIDE 80 MG PO TABS: 80.0000 mg | ORAL_TABLET | Freq: Two times a day (BID) | ORAL | 2 refills | Status: DC

## 2019-06-27 NOTE — Telephone Encounter (Signed)
I called patient and advised he would have to get cardiac and PCP clearance prior to any surgery being scheduled.  He understands and is still undergoing cardiac testing for lower extremity swelling.

## 2019-07-05 ENCOUNTER — Ambulatory Visit: Payer: 59

## 2019-07-05 ENCOUNTER — Other Ambulatory Visit: Payer: Self-pay

## 2019-07-05 DIAGNOSIS — R600 Localized edema: Secondary | ICD-10-CM

## 2019-07-10 ENCOUNTER — Other Ambulatory Visit: Payer: Self-pay

## 2019-07-10 ENCOUNTER — Ambulatory Visit: Payer: 59 | Admitting: Cardiology

## 2019-07-10 ENCOUNTER — Encounter: Payer: Self-pay | Admitting: Cardiology

## 2019-07-10 VITALS — BP 153/89 | HR 86 | Temp 98.2°F | Resp 18 | Ht 73.0 in | Wt 327.0 lb

## 2019-07-10 DIAGNOSIS — Z01810 Encounter for preprocedural cardiovascular examination: Secondary | ICD-10-CM | POA: Insufficient documentation

## 2019-07-10 DIAGNOSIS — I10 Essential (primary) hypertension: Secondary | ICD-10-CM

## 2019-07-10 DIAGNOSIS — R600 Localized edema: Secondary | ICD-10-CM

## 2019-07-10 MED ORDER — SPIRONOLACTONE 50 MG PO TABS: 50.0000 mg | ORAL_TABLET | Freq: Every day | ORAL | 2 refills | Status: DC

## 2019-07-10 NOTE — Progress Notes (Signed)
Patient referred by Lin Landsman, MD for leg edema  Subjective:   Jerry Jennings, male    DOB: 1966/01/10, 54 y.o.   MRN: [id number]   Chief Complaint  Patient presents with  . Leg Swelling  . Follow-up    2 weeks    HPI  54 y.o. African American male with hypertension, obesity, chronic hip pain, referred for evaluation of leg edema.   Echocardiogram shows mod LVH, normal EF, grade 1 DD. No other significant abnormalities noted. He has lost 2 lbs since last visit. His leg swelling has improve, but not resolved. He notices that his leg swelling is worse when he takes lasix with atenolol, but better when he takes lasix alone.   He is hoping to undergo hip surgery soon.    Initial consultation HPI 06/2019: Patient has noticed right more than left leg edema for last 2 months.  Denies any exertional dyspnea, orthopnea, PND symptoms.  He has chest wall pain, only when he is using both crutches for walking.  He has had severe left hip pain and mobility limitation.  He is awaiting hip replacement surgery.  He previously used to be a Administrator.  He currently works at a [employer].  He denies any smoking, drinks only occasionally.  He is to consume a lot of NSAIDs for his hip pain, but has stopped them as they do not help anymore.  Is currently using Lasix 40 mg 1 tablet twice daily without any significant improvement of his leg edema.  Leg swelling improves in the morning, its worse throughout the day.  He is unable to wear compression stockings.    Current Outpatient Medications on File Prior to Visit  Medication Sig Dispense Refill  . acetaminophen (TYLENOL) 500 MG tablet Take 1,000 mg by mouth every 6 (six) hours as needed (for pain).     Marland Kitchen atenolol (TENORMIN) 50 MG tablet Take 1 tablet (50 mg total) by mouth daily. 30 tablet 0  . furosemide (LASIX) 80 MG tablet Take 1 tablet (80 mg total) by mouth 2 (two) times daily for 10 days. 90 tablet 2  . ibuprofen (ADVIL) 200 MG tablet  Take 600-800 mg by mouth every 6 (six) hours as needed for mild pain or moderate pain.    . potassium chloride SA (KLOR-CON M20) 20 MEQ tablet Take 1 tablet (20 mEq total) by mouth 2 (two) times daily. 60 tablet 2   No current facility-administered medications on file prior to visit.    Cardiovascular and other pertinent studies:  Echocardiogram 07/05/2019:  Left ventricle cavity is normal in size. Severe concentric hypertrophy of  the left ventricle. Normal global wall motion. Normal LV systolic function  with visual EF 50-55%. Doppler evidence of grade I (impaired) diastolic  dysfunction, normal LAP.  Left atrial cavity is moderately dilated. Aneurysmal interatrial septum  without 2D or color Doppler evidence of interatrial shunt.  No significant valvular abnormality.  IVC is dilated with a respiratory response of >50%. Estimated RA pressure  8 mmHg.  EKG 06/26/2019: Sinus rhythm 82 bpm. Nonspecific T wave inversion.   Recent labs: 05/03/2019: Glucose 96, BUN/Cr 15/0.78. EGFR 60. Na/K 138/4.40. H/H 13.5/45.5. MCV 79.4. Platelets 220    Review of Systems  Cardiovascular: Positive for leg swelling. Negative for chest pain, dyspnea on exertion, palpitations and syncope.  Musculoskeletal: Positive for joint pain.         Vitals:   07/10/19 1400  BP: (!) 153/89  Pulse: 86  Resp: 18  Temp: 98.2 F (36.8 C)  SpO2: 98%     Body mass index is 43.14 kg/m. Filed Weights   07/10/19 1400  Weight: (!) 327 lb (148.3 kg)     Objective:   Physical Exam  Constitutional: He appears well-developed and well-nourished.  Neck: No JVD present.  Cardiovascular: Normal rate, regular rhythm, normal heart sounds and intact distal pulses.  No murmur heard. Pulmonary/Chest: Effort normal and breath sounds normal. He has no wheezes. He has no rales.  Musculoskeletal:        General: Edema (3+ b/l) present.  Nursing note and vitals reviewed.       Assessment & Recommendations:    54 y.o. African American male with hypertension, obesity, chronic hip pain, leg edema.   Leg edema: Likely due to combination of hypertension and HFpEF.  Switch atenolol 50 mg to spironolactone 50 mg daily. Continue lasix 80 mg bid without potassium. Check BMP, BNP in 1 week.  Pre-op risk stratification: Limited baseline functional capacity due to hip pain. Recommend lexiscan nuclear stress test.  F/u in 2 weeks  Adiel Erney Esther Hardy, MD [facility] [place] Pager: [phone] Office: [phone]

## 2019-07-21 LAB — COMPREHENSIVE METABOLIC PANEL
ALT: 13 IU/L (ref 0–44)
AST: 19 IU/L (ref 0–40)
Albumin/Globulin Ratio: 1.1 — ABNORMAL LOW (ref 1.2–2.2)
Albumin: 4 g/dL (ref 3.8–4.9)
Alkaline Phosphatase: 85 IU/L (ref 39–117)
BUN/Creatinine Ratio: 19 (ref 9–20)
BUN: 15 mg/dL (ref 6–24)
Bilirubin Total: 0.4 mg/dL (ref 0.0–1.2)
CO2: 24 mmol/L (ref 20–29)
Calcium: 8.7 mg/dL (ref 8.7–10.2)
Chloride: 101 mmol/L (ref 96–106)
Creatinine, Ser: 0.79 mg/dL (ref 0.76–1.27)
GFR calc Af Amer: 119 mL/min/{1.73_m2} (ref >59)
GFR calc non Af Amer: 103 mL/min/{1.73_m2} (ref >59)
Globulin, Total: 3.5 g/dL (ref 1.5–4.5)
Glucose: 89 mg/dL (ref 65–99)
Potassium: 4 mmol/L (ref 3.5–5.2)
Sodium: 140 mmol/L (ref 134–144)
Total Protein: 7.5 g/dL (ref 6.0–8.5)

## 2019-07-21 LAB — BRAIN NATRIURETIC PEPTIDE

## 2019-07-21 NOTE — Progress Notes (Signed)
Please check with [facility] as to why the BNP was "cancelled" This is a recurring issue.   Thanks MJP

## 2019-07-24 ENCOUNTER — Other Ambulatory Visit: Payer: 59

## 2019-07-24 ENCOUNTER — Telehealth: Payer: Self-pay

## 2019-07-24 NOTE — Telephone Encounter (Signed)
Called patient regarding his legs swelling and some other issues he is having. NA, LMAM. Will try again later.

## 2019-07-25 NOTE — Telephone Encounter (Signed)
Only other option is hospital admission to evaluate for etiologies of edema, and IV diuresis. Ask him if he would like to be seen in the ED, or electively be admitted for IV diuresis.

## 2019-07-25 NOTE — Telephone Encounter (Signed)
We have been addressing this precise question in all his previous appts. I have recommend lasix 80 mg bid + spironolactone 50 mg daily.   Thanks MJP

## 2019-07-25 NOTE — Telephone Encounter (Signed)
Patient is not accepting my response and is requesting you give him a call. Thank you.

## 2019-07-25 NOTE — Telephone Encounter (Signed)
Patient stated that he has been taken them as directed and it is not helping at all and the swelling is getting worse. He said that, it is only making him "cramp up and pee all day".

## 2019-07-25 NOTE — Telephone Encounter (Signed)
Patient stated that his legs are swelling and it is getting worse and he is upset that noone has addressed the issue,  nor has anyone started him on any fluid medication.  He stated that now his foot and ankle is swelling and the bottom of his foot is getting darker. He wants to know what he should do at this point. Please advise.

## 2019-07-26 ENCOUNTER — Other Ambulatory Visit: Payer: Self-pay | Admitting: Cardiology

## 2019-07-26 DIAGNOSIS — R600 Localized edema: Secondary | ICD-10-CM

## 2019-07-26 NOTE — Telephone Encounter (Signed)
Spoke with the patient. Recommended consideration for hospital admission for IV diuresis, outpatient venous insufficiency study. Patient wants to hold off hospital admission for now. Stress test scheduled on 4/26. Will obtain venous insufficiency duplex study.

## 2019-07-27 ENCOUNTER — Other Ambulatory Visit: Payer: Self-pay | Admitting: Cardiology

## 2019-07-27 DIAGNOSIS — R600 Localized edema: Secondary | ICD-10-CM

## 2019-07-27 DIAGNOSIS — I872 Venous insufficiency (chronic) (peripheral): Secondary | ICD-10-CM

## 2019-07-28 ENCOUNTER — Telehealth: Payer: Self-pay

## 2019-07-28 NOTE — Telephone Encounter (Signed)
Telephone encounter:  Reason for call: Patient called and wanted to what he can take for his leg pain?  Usual provider: MP  Last office visit: 07/10/19  Next office visit: NA   Last hospitalization: 05/03/19   Current Outpatient Medications on File Prior to Visit  Medication Sig Dispense Refill  . acetaminophen (TYLENOL) 500 MG tablet Take 1,000 mg by mouth every 6 (six) hours as needed (for pain).     . furosemide (LASIX) 80 MG tablet Take 1 tablet (80 mg total) by mouth 2 (two) times daily for 10 days. 90 tablet 2  . ibuprofen (ADVIL) 200 MG tablet Take 600-800 mg by mouth every 6 (six) hours as needed for mild pain or moderate pain.    Marland Kitchen spironolactone (ALDACTONE) 50 MG tablet Take 1 tablet (50 mg total) by mouth daily. 30 tablet 2   No current facility-administered medications on file prior to visit.

## 2019-07-28 NOTE — Telephone Encounter (Signed)
Needs in office visit. I do not know what is causing his leg pain until I examine and see if it is due to any vascular cause. Please schedule OV on Monday 4/26 with me.  Thanks MJP

## 2019-07-31 ENCOUNTER — Other Ambulatory Visit: Payer: Self-pay

## 2019-07-31 ENCOUNTER — Encounter: Payer: Self-pay | Admitting: Cardiology

## 2019-07-31 ENCOUNTER — Ambulatory Visit: Payer: 59 | Admitting: Cardiology

## 2019-07-31 ENCOUNTER — Ambulatory Visit: Payer: 59

## 2019-07-31 VITALS — BP 121/72 | HR 95 | Ht 73.0 in | Wt 317.0 lb

## 2019-07-31 DIAGNOSIS — R600 Localized edema: Secondary | ICD-10-CM

## 2019-07-31 DIAGNOSIS — I10 Essential (primary) hypertension: Secondary | ICD-10-CM

## 2019-07-31 NOTE — Progress Notes (Signed)
Patient referred by Lin Landsman, MD for leg edema  Subjective:   Jerry Jennings, male    DOB: April 20, 1965, 54 y.o.   MRN: [id number]   Chief Complaint  Patient presents with  . Leg Pain    HPI  54 y.o. African American male with hypertension, obesity, chronic hip pain, presents to the office with a chief complaint of leg pain.   Patient was scheduled for nuclear stress test prior to preoperative risk stratification for upcoming noncardiac surgery.  However, patient was unable to sit down due to hip pain and therefore recommended to have the stress test done at the hospital.  He was added onto today's schedule for evaluation of leg pain.  In the past the doses of Lasix and spironolactone were uptitrated to improve diuresis.  Patient states that he has noted decreased swelling in bilateral lower extremities.  But over the last couple weeks the swelling has resurfaced.  Patient is not very compliant with dietary recommendations.  He continues to have high salt diet.  He is educated on importance of decreasing canned foods and overall salt intake.  Patient has lost approximately 10 pounds his last office visit.  Echocardiogram shows mod LVH, normal EF, grade 1 DD. No other significant abnormalities noted.   He denies orthopnea, paroxysmal nocturnal dyspnea.  Patient ambulates with 2 canes given his osteoarthritis of the hip. And also requesting pain medications for the same.  Current Outpatient Medications on File Prior to Visit  Medication Sig Dispense Refill  . furosemide (LASIX) 80 MG tablet Take 1 tablet (80 mg total) by mouth 2 (two) times daily for 10 days. 90 tablet 2  . spironolactone (ALDACTONE) 50 MG tablet Take 1 tablet (50 mg total) by mouth daily. 30 tablet 2   No current facility-administered medications on file prior to visit.    Cardiovascular and other pertinent studies:  Echocardiogram 07/05/2019:  Left ventricle cavity is normal in size. Severe concentric  hypertrophy of  the left ventricle. Normal global wall motion. Normal LV systolic function  with visual EF 50-55%. Doppler evidence of grade I (impaired) diastolic  dysfunction, normal LAP.  Left atrial cavity is moderately dilated. Aneurysmal interatrial septum  without 2D or color Doppler evidence of interatrial shunt.  No significant valvular abnormality.  IVC is dilated with a respiratory response of >50%. Estimated RA pressure  8 mmHg.  EKG 06/26/2019: Sinus rhythm 82 bpm. Nonspecific T wave inversion.   Recent labs: 05/03/2019: Glucose 96, BUN/Cr 15/0.78. EGFR 60. Na/K 138/4.40. H/H 13.5/45.5. MCV 79.4. Platelets 220  Review of Systems  Constitution: Negative for weight loss.  Cardiovascular: Positive for leg swelling. Negative for chest pain, claudication, dyspnea on exertion, orthopnea, palpitations, paroxysmal nocturnal dyspnea and syncope.  Musculoskeletal: Positive for joint pain.       Vitals with BMI 07/31/2019 07/10/2019 06/26/2019  Height 6' 1"  6' 1"  -  Weight 317 lbs 327 lbs -  BMI 03.83 33.83 -  Systolic 291 916 606  Diastolic 72 89 89  Pulse 95 86 81    Objective:   Physical Exam  Constitutional: He appears well-developed and well-nourished.  Neck: No JVD present.  Cardiovascular: Normal rate, regular rhythm, normal heart sounds and intact distal pulses.  No murmur heard. Pulmonary/Chest: Effort normal and breath sounds normal. He has no wheezes. He has no rales.  Abdominal: Soft. Bowel sounds are normal. He exhibits no distension. There is no abdominal tenderness. There is no rebound.  Musculoskeletal:  General: Edema (2+ b/l) present.     Comments: Warm to touch bilaterally, palpable dorsalis pedis and posterior tibial pulses.  No gangrene.  Nursing note and vitals reviewed.      Assessment & Recommendations:   54 y.o. African American male with hypertension, obesity, chronic hip pain, leg edema.   Leg edema: Differential diagnosis includes  venous insufficiency, HFpEF, noncompliance with dietary changes. Continue Aldactone as well as Lasix. Recommend lower extremity duplex to rule out DVT and to evaluate for chronic venous insufficiency. Most recent blood work from July 19, 2019 reviewed with the patient. Educated on the importance of low-salt diet. Elevate the legs when possible. Reencouraged compression stockings. Recommended reevaluation with primary cardiologist Dr. Vernell Leep in about 2 weeks.  Pre-op risk stratification: Limited baseline functional capacity due to hip pain. Recommend lexiscan nuclear stress test.   Orders Placed This Encounter  Procedures  . Basic metabolic panel  . Magnesium  . PCV LOWER VENOUS US (BILATERAL)   --Return in about 18 days (around 08/18/2019) for leg swelling. Or sooner if needed. --Continue follow-up with your primary care physician regarding the management of your other chronic comorbid conditions.  Patient's questions and concerns were addressed to his satisfaction. He voices understanding of the instructions provided during this encounter.   This note was created using a voice recognition software as a result there may be grammatical errors inadvertently enclosed that do not reflect the nature of this encounter. Every attempt is made to correct such errors.  Rex Kras, DO, [facility]. [place] Office: [phone]

## 2019-08-07 ENCOUNTER — Other Ambulatory Visit: Payer: Self-pay | Admitting: Cardiology

## 2019-08-08 ENCOUNTER — Other Ambulatory Visit: Payer: Self-pay

## 2019-08-08 DIAGNOSIS — R600 Localized edema: Secondary | ICD-10-CM

## 2019-08-16 ENCOUNTER — Ambulatory Visit (HOSPITAL_COMMUNITY)
Admission: RE | Admit: 2019-08-16 | Discharge: 2019-08-16 | Disposition: A | Discharge status: Home / Self Care | Payer: 59 | Source: Ambulatory Visit | Attending: Cardiology | Admitting: Cardiology

## 2019-08-16 ENCOUNTER — Other Ambulatory Visit: Payer: Self-pay

## 2019-08-16 DIAGNOSIS — Z01810 Encounter for preprocedural cardiovascular examination: Secondary | ICD-10-CM | POA: Insufficient documentation

## 2019-08-16 MED ORDER — TECHNETIUM TC 99M TETROFOSMIN IV KIT
30.6000 | PACK | Freq: Once | INTRAVENOUS | Status: AC | PRN
  Administered 2019-08-16: 12:00:00 30.6 via INTRAVENOUS

## 2019-08-21 ENCOUNTER — Encounter (HOSPITAL_COMMUNITY)
Admission: RE | Admit: 2019-08-21 | Discharge: 2019-08-21 | Disposition: A | Discharge status: Home / Self Care | Payer: 59 | Source: Ambulatory Visit | Attending: Cardiology | Admitting: Cardiology

## 2019-08-21 DIAGNOSIS — Z01810 Encounter for preprocedural cardiovascular examination: Secondary | ICD-10-CM | POA: Insufficient documentation

## 2019-08-21 MED ORDER — REGADENOSON 0.4 MG/5ML IV SOLN
INTRAVENOUS | Status: AC
  Administered 2019-08-21: 0.4 mg via INTRAVENOUS
  Filled 2019-08-21: qty 5

## 2019-08-21 MED ORDER — REGADENOSON 0.4 MG/5ML IV SOLN: 0.4000 mg | Freq: Once | INTRAVENOUS | Status: AC

## 2019-08-21 MED ORDER — TECHNETIUM TC 99M TETROFOSMIN IV KIT
31.1000 | PACK | Freq: Once | INTRAVENOUS | Status: AC | PRN
  Administered 2019-08-21: 31.1 via INTRAVENOUS

## 2019-08-21 MED ORDER — TECHNETIUM TC 99M TETROFOSMIN IV KIT: 30.6000 | PACK | Freq: Once | INTRAVENOUS | Status: DC | PRN

## 2019-08-22 ENCOUNTER — Encounter (HOSPITAL_COMMUNITY): Payer: 59

## 2019-08-22 NOTE — Progress Notes (Signed)
Ok

## 2019-08-23 ENCOUNTER — Ambulatory Visit
Admission: RE | Admit: 2019-08-23 | Discharge: 2019-08-23 | Disposition: A | Discharge status: Home / Self Care | Payer: 59 | Source: Ambulatory Visit | Attending: Cardiology | Admitting: Cardiology

## 2019-08-23 ENCOUNTER — Other Ambulatory Visit (HOSPITAL_COMMUNITY): Payer: 59

## 2019-08-23 DIAGNOSIS — R600 Localized edema: Secondary | ICD-10-CM

## 2019-08-23 DIAGNOSIS — I872 Venous insufficiency (chronic) (peripheral): Secondary | ICD-10-CM

## 2019-08-30 ENCOUNTER — Ambulatory Visit: Payer: 59 | Admitting: Cardiology

## 2019-09-01 NOTE — Progress Notes (Signed)
Patient referred by Lin Landsman, MD for leg edema  Subjective:   Jerry Jennings, male    DOB: April 29, 1965, 54 y.o.   MRN: [id number]   Chief Complaint  Patient presents with  . Hypertension  . Leg Swelling  . Results  . Follow-up    HPI  54 y.o. African American male with hypertension, obesity, chronic hip pain, leg edema.   Stress test showed no ischemia. Blood pressure remains elevated. He is not taking atenolol that he was previously on.   Current Outpatient Medications on File Prior to Visit  Medication Sig Dispense Refill  . furosemide (LASIX) 80 MG tablet Take 1 tablet (80 mg total) by mouth 2 (two) times daily for 10 days. 90 tablet 2  . spironolactone (ALDACTONE) 50 MG tablet Take 1 tablet (50 mg total) by mouth daily. 30 tablet 2   No current facility-administered medications on file prior to visit.    Cardiovascular and other pertinent studies:  Lexiscan nuclear stress test 08/2019: 1. No reversible ischemia or infarction. 2. Normal left ventricular wall motion. 3. Left ventricular ejection fraction 54% 4. Non invasive risk stratification*: Low  Echocardiogram 07/05/2019:  Left ventricle cavity is normal in size. Severe concentric hypertrophy of  the left ventricle. Normal global wall motion. Normal LV systolic function  with visual EF 50-55%. Doppler evidence of grade I (impaired) diastolic  dysfunction, normal LAP.  Left atrial cavity is moderately dilated. Aneurysmal interatrial septum  without 2D or color Doppler evidence of interatrial shunt.  No significant valvular abnormality.  IVC is dilated with a respiratory response of >50%. Estimated RA pressure  8 mmHg.  EKG 06/26/2019: Sinus rhythm 82 bpm. Nonspecific T wave inversion.   Recent labs: 05/03/2019: Glucose 96, BUN/Cr 15/0.78. EGFR 60. Na/K 138/4.40. H/H 13.5/45.5. MCV 79.4. Platelets 220   Review of Systems  Cardiovascular: Positive for leg swelling. Negative for chest pain, dyspnea on  exertion, palpitations and syncope.  Musculoskeletal: Positive for joint pain.        Vitals:   09/06/19 1303  BP: (!) 180/101  Pulse: 86  Resp: 17  SpO2: 99%     Body mass index is 43.01 kg/m. Filed Weights   09/06/19 1303  Weight: (!) 326 lb (147.9 kg)     Objective:     Physical Exam  Constitutional: No distress.  Neck: No JVD present.  Cardiovascular: Normal rate, regular rhythm, normal heart sounds and intact distal pulses.  No murmur heard. Pulmonary/Chest: Effort normal and breath sounds normal. He has no wheezes. He has no rales.  Musculoskeletal:        General: No edema.  Nursing note and vitals reviewed.       Assessment & Recommendations:   54 y.o. African American male with hypertension, obesity, chronic hip pain, leg edema.   Leg edema: Resolved.  Hypertension: Uncontrolled. Continue spironolactone 50 mg daily. Resume atenolol 50 mg daily.  Continue lasix 80 daily without potassium.  Pre-op risk stratification: Low risk stress test With above changes, I expect his blood pressure to improve. May proceed with hip surgery with low cardiac risk.  Continue follow up with PCP. I will see him on as needed basis.   Nigel Mormon, MD [facility] [place] Pager: [phone] Office: [phone]

## 2019-09-06 ENCOUNTER — Other Ambulatory Visit: Payer: Self-pay

## 2019-09-06 ENCOUNTER — Ambulatory Visit: Payer: 59 | Admitting: Cardiology

## 2019-09-06 ENCOUNTER — Encounter: Payer: Self-pay | Admitting: Cardiology

## 2019-09-06 VITALS — BP 180/101 | HR 86 | Resp 17 | Ht 73.0 in | Wt 326.0 lb

## 2019-09-06 DIAGNOSIS — R600 Localized edema: Secondary | ICD-10-CM

## 2019-09-06 DIAGNOSIS — I10 Essential (primary) hypertension: Secondary | ICD-10-CM

## 2019-09-06 DIAGNOSIS — Z01810 Encounter for preprocedural cardiovascular examination: Secondary | ICD-10-CM

## 2019-09-06 MED ORDER — ATENOLOL 50 MG PO TABS: 50.0000 mg | ORAL_TABLET | Freq: Every day | ORAL | 3 refills | Status: DC

## 2019-09-12 ENCOUNTER — Telehealth: Payer: Self-pay | Admitting: Orthopaedic Surgery

## 2019-09-12 NOTE — Telephone Encounter (Signed)
Pt called stating he got cleared by his Dr for surgery and would like to proceed with that now.   [phone]

## 2019-09-12 NOTE — Telephone Encounter (Signed)
Please see below and advise.

## 2019-09-12 NOTE — Telephone Encounter (Signed)
I called and discussed. Has THN ACO and goal was 303 lbs and he was at 306.  Now he is at 326 per cardiology note. His insurance will allow elective THA until his BMI is less than 40. He states he is only eating one meal a day. Call him back and see if he wants Dr. Dalbert Garnet referral. Make him appt in a couple months.

## 2019-09-13 NOTE — Telephone Encounter (Signed)
I called patient to see if he would like referral to Dr. Dalbert Garnet. Someone picked up phone and hung up.

## 2019-09-14 NOTE — Telephone Encounter (Signed)
I left voicemail for return call. 

## 2020-12-25 ENCOUNTER — Encounter (HOSPITAL_COMMUNITY): Payer: Self-pay

## 2020-12-25 ENCOUNTER — Emergency Department (HOSPITAL_BASED_OUTPATIENT_CLINIC_OR_DEPARTMENT_OTHER): Payer: Self-pay

## 2020-12-25 ENCOUNTER — Emergency Department (HOSPITAL_COMMUNITY): Payer: Self-pay

## 2020-12-25 ENCOUNTER — Other Ambulatory Visit: Payer: Self-pay

## 2020-12-25 ENCOUNTER — Emergency Department (HOSPITAL_COMMUNITY)
Admission: EM | Admit: 2020-12-25 | Discharge: 2020-12-25 | Disposition: A | Discharge status: Home / Self Care | Payer: Self-pay | Attending: Emergency Medicine | Admitting: Emergency Medicine

## 2020-12-25 DIAGNOSIS — I10 Essential (primary) hypertension: Secondary | ICD-10-CM

## 2020-12-25 DIAGNOSIS — I110 Hypertensive heart disease with heart failure: Secondary | ICD-10-CM | POA: Insufficient documentation

## 2020-12-25 DIAGNOSIS — R600 Localized edema: Secondary | ICD-10-CM

## 2020-12-25 DIAGNOSIS — R609 Edema, unspecified: Secondary | ICD-10-CM

## 2020-12-25 DIAGNOSIS — I509 Heart failure, unspecified: Secondary | ICD-10-CM | POA: Insufficient documentation

## 2020-12-25 LAB — CBC WITH DIFFERENTIAL/PLATELET
Abs Immature Granulocytes: 0.1 10*3/uL — ABNORMAL HIGH (ref 0.00–0.07)
Basophils Absolute: 0 10*3/uL (ref 0.0–0.1)
Basophils Relative: 0 %
Eosinophils Absolute: 0 10*3/uL (ref 0.0–0.5)
Eosinophils Relative: 0 %
HCT: 46.6 % (ref 39.0–52.0)
Hemoglobin: 14.1 g/dL (ref 13.0–17.0)
Immature Granulocytes: 1 %
Lymphocytes Relative: 11 %
Lymphs Abs: 1.2 10*3/uL (ref 0.7–4.0)
MCH: 24.7 pg — ABNORMAL LOW (ref 26.0–34.0)
MCHC: 30.3 g/dL (ref 30.0–36.0)
MCV: 81.8 fL (ref 80.0–100.0)
Monocytes Absolute: 1 10*3/uL (ref 0.1–1.0)
Monocytes Relative: 9 %
Neutro Abs: 8.5 10*3/uL — ABNORMAL HIGH (ref 1.7–7.7)
Neutrophils Relative %: 79 %
Platelets: 243 10*3/uL (ref 150–400)
RBC: 5.7 MIL/uL (ref 4.22–5.81)
RDW: 13.6 % (ref 11.5–15.5)
WBC: 10.8 10*3/uL — ABNORMAL HIGH (ref 4.0–10.5)
nRBC: 0 % (ref 0.0–0.2)

## 2020-12-25 LAB — COMPREHENSIVE METABOLIC PANEL
ALT: 51 U/L — ABNORMAL HIGH (ref 0–44)
AST: 76 U/L — ABNORMAL HIGH (ref 15–41)
Albumin: 3.8 g/dL (ref 3.5–5.0)
Alkaline Phosphatase: 86 U/L (ref 38–126)
Anion gap: 9 (ref 5–15)
BUN: 16 mg/dL (ref 6–20)
CO2: 27 mmol/L (ref 22–32)
Calcium: 9.2 mg/dL (ref 8.9–10.3)
Chloride: 106 mmol/L (ref 98–111)
Creatinine, Ser: 1.16 mg/dL (ref 0.61–1.24)
GFR, Estimated: >60 mL/min (ref >60)
Glucose, Bld: 136 mg/dL — ABNORMAL HIGH (ref 70–99)
Potassium: 4 mmol/L (ref 3.5–5.1)
Sodium: 142 mmol/L (ref 135–145)
Total Bilirubin: 1 mg/dL (ref 0.3–1.2)
Total Protein: 8.1 g/dL (ref 6.5–8.1)

## 2020-12-25 LAB — BRAIN NATRIURETIC PEPTIDE: B Natriuretic Peptide: 29 pg/mL (ref 0.0–100.0)

## 2020-12-25 LAB — CBG MONITORING, ED: Glucose-Capillary: 110 mg/dL — ABNORMAL HIGH (ref 70–99)

## 2020-12-25 MED ORDER — ATENOLOL 50 MG PO TABS: 50.0000 mg | ORAL_TABLET | Freq: Every day | ORAL | 0 refills | Status: DC

## 2020-12-25 MED ORDER — FUROSEMIDE 80 MG PO TABS: 80.0000 mg | ORAL_TABLET | Freq: Every day | ORAL | 0 refills | Status: DC

## 2020-12-25 MED ORDER — FUROSEMIDE 10 MG/ML IJ SOLN
80.0000 mg | Freq: Once | INTRAMUSCULAR | Status: AC
  Administered 2020-12-25: 80 mg via INTRAVENOUS
  Filled 2020-12-25: qty 8

## 2020-12-25 MED ORDER — SPIRONOLACTONE 50 MG PO TABS: 50.0000 mg | ORAL_TABLET | Freq: Every day | ORAL | 0 refills | Status: DC

## 2020-12-25 NOTE — ED Notes (Signed)
Pt requesting social work consult due to homelessness and stating "I can't walk on my legs". PA notified.

## 2020-12-25 NOTE — Progress Notes (Signed)
Match program and letter given and explained to the patient.

## 2020-12-25 NOTE — ED Notes (Signed)
Pt voicing discontent with being discharged stating "Can y'all call [facility] tell them I need rehab for drugs or something". Pt provided bus pass and sandwich after conferring with Case Manager. Pt stated "I'm going to just call the cops so I can go to jail or something".

## 2020-12-25 NOTE — Progress Notes (Signed)
Left lower extremity venous duplex has been completed. Preliminary results can be found in CV Proc through chart review.  Results were given to [facility] PA.  12/25/20 8:34 AM Olen Cordial RVT

## 2020-12-25 NOTE — Discharge Instructions (Addendum)
Continue taking home medications as prescribed. Go to the [facility] outpatient pharmacy to get these prescriptions filled. Follow-up with a primary care clinic listed below for further evaluation. Return to the emergency room with any new, worsening, or concerning symptoms

## 2020-12-25 NOTE — ED Provider Notes (Signed)
[facility]-EMERGENCY DEPT Provider Note   CSN: [id number] Arrival date & time: 12/25/20  0331     History Chief Complaint  Patient presents with   Leg Pain   Leg Swelling    Jerry Jennings is a 55 y.o. male presenting for evaluation of leg pain and swelling.  Patient states he has been homeless for about a month, as such, he has been unable to take his medications.  In the past month, he has had worsening symptoms, especially in the last 24 hours.  He reports bilateral leg swelling and pain.  Symptoms are worse on her on his left leg.  He also reports dyspnea on exertion, no shortness of breath at rest.  He denies fevers, chills, sore throat, chest pain, cough, nausea, vomiting, abdominal pain.  He reports normal urination and normal bowel movements.  He used to be on fluid pills for heart failure and blood pressure medications.  He denies fall, trauma, or injury.  No new numbness or tingling.  HPI     Past Medical History:  Diagnosis Date   Back ache    Hip arthritis    Hypertension     Patient Active Problem List   Diagnosis Date Noted   Preop cardiovascular exam 07/10/2019   Essential hypertension 06/23/2019   Leg edema 06/23/2019   Unilateral primary osteoarthritis, left hip 02/09/2019    Past Surgical History:  Procedure Laterality Date   APPENDECTOMY         Family History  Problem Relation Age of Onset   Heart attack Father    Diabetes Father    Cancer Mother    Stroke Sister    Hypertension Sister    Prostate cancer Brother     Social History   Tobacco Use   Smoking status: Never   Smokeless tobacco: Never  Vaping Use   Vaping Use: Never used  Substance Use Topics   Alcohol use: Not Currently   Drug use: No    Home Medications Prior to Admission medications   Medication Sig Start Date End Date Taking? Authorizing Provider  atenolol (TENORMIN) 50 MG tablet Take 1 tablet (50 mg total) by mouth daily. 12/25/20 01/24/21   Lyndy Russman, PA-C  furosemide (LASIX) 80 MG tablet Take 1 tablet (80 mg total) by mouth daily. 12/25/20 01/24/21  Irisha Grandmaison, PA-C  spironolactone (ALDACTONE) 50 MG tablet Take 1 tablet (50 mg total) by mouth daily. 12/25/20   Ayiana Winslett, PA-C    Allergies    Sulfa antibiotics  Review of Systems   Review of Systems  Respiratory:  Positive for shortness of breath.   Cardiovascular:  Positive for leg swelling.  All other systems reviewed and are negative.  Physical Exam Updated Vital Signs BP (!) 146/79   Pulse (!) 102   Temp 98.4 F (36.9 C) (Oral)   Resp 19   SpO2 100%   Physical Exam Vitals and nursing note reviewed.  Constitutional:      General: He is not in acute distress.    Appearance: Normal appearance. He is obese.  HENT:     Head: Normocephalic and atraumatic.  Eyes:     Extraocular Movements: Extraocular movements intact.     Conjunctiva/sclera: Conjunctivae normal.     Pupils: Pupils are equal, round, and reactive to light.  Cardiovascular:     Rate and Rhythm: Regular rhythm. Tachycardia present.     Pulses: Normal pulses.     Comments: Mildly tachycardic at 105 Pulmonary:  Effort: Pulmonary effort is normal. No respiratory distress.     Breath sounds: Normal breath sounds. No wheezing.     Comments: Speaking in full sentences.  Clear lung sounds in all fields. Abdominal:     General: There is no distension.     Palpations: Abdomen is soft. There is no mass.     Tenderness: There is no abdominal tenderness. There is no guarding or rebound.  Musculoskeletal:        General: Swelling and tenderness present. Normal range of motion.     Cervical back: Normal range of motion and neck supple.     Right lower leg: Edema present.     Left lower leg: Edema present.     Comments: Bilateral leg swelling, left greater than right.  Unable to feel pedal pulses due to swelling, however there is good warmth, distal sensation, and cap refill.   Diffuse tenderness palpation of bilateral lower legs.  Skin:    General: Skin is warm and dry.     Capillary Refill: Capillary refill takes less than 2 seconds.  Neurological:     Mental Status: He is alert and oriented to person, place, and time.  Psychiatric:        Mood and Affect: Mood and affect normal.        Speech: Speech normal.        Behavior: Behavior normal.    ED Results / Procedures / Treatments   Labs (all labs ordered are listed, but only abnormal results are displayed) Labs Reviewed  CBC WITH DIFFERENTIAL/PLATELET - Abnormal; Notable for the following components:      Result Value   WBC 10.8 (*)    MCH 24.7 (*)    Neutro Abs 8.5 (*)    Abs Immature Granulocytes 0.10 (*)    All other components within normal limits  COMPREHENSIVE METABOLIC PANEL - Abnormal; Notable for the following components:   Glucose, Bld 136 (*)    AST 76 (*)    ALT 51 (*)    All other components within normal limits  CBG MONITORING, ED - Abnormal; Notable for the following components:   Glucose-Capillary 110 (*)    All other components within normal limits  BRAIN NATRIURETIC PEPTIDE    EKG EKG Interpretation  Date/Time:  Wednesday December 25 2020 07:40:48 EDT Ventricular Rate:  104 PR Interval:  194 QRS Duration: 102 QT Interval:  358 QTC Calculation: 471 R Axis:   64 Text Interpretation: Sinus tachycardia Confirmed by Alvester Chou [phone]) on 12/25/2020 8:36:10 AM  Radiology DG Chest 2 View  Result Date: 12/25/2020 CLINICAL DATA:  Shortness of breath, RIGHT-sided chest pain, onset of symptoms this morning, hypertension EXAM: CHEST - 2 VIEW COMPARISON:  05/03/2019 FINDINGS: Normal heart size and mediastinal contours for lordotic semi erect AP technique. Pulmonary vascularity normal. Lungs clear. No infiltrate, pleural effusion, or pneumothorax. Degenerative disc disease changes thoracic spine. IMPRESSION: No acute abnormalities. Electronically Signed   By: Ulyses Southward M.D.    On: 12/25/2020 08:38   VAS [place] LOWER EXTREMITY VENOUS (DVT) ([facility] and [facility] 7a-7p)  Result Date: 12/25/2020  Lower Venous DVT Study Patient Name:  Jerry Jennings  Date of Exam:   12/25/2020 Medical Rec #: [id number]     Accession #:    [id number] Date of Birth: [birth year]    Patient Gender: M Patient Age:   54 years Exam Location:  [facility] Procedure:      VAS [place] LOWER EXTREMITY VENOUS (DVT) Referring  Phys: Vallory Oetken --------------------------------------------------------------------------------  Indications: Edema.  Risk Factors: None identified. Limitations: Body habitus and poor ultrasound/tissue interface. Comparison Study: No prior studies. Performing Technologist: Chanda Busing RVT  Examination Guidelines: A complete evaluation includes B-mode imaging, spectral Doppler, color Doppler, and power Doppler as needed of all accessible portions of each vessel. Bilateral testing is considered an integral part of a complete examination. Limited examinations for reoccurring indications may be performed as noted. The reflux portion of the exam is performed with the patient in reverse Trendelenburg.  +-----+---------------+---------+-----------+----------+--------------+ RIGHTCompressibilityPhasicitySpontaneityPropertiesThrombus Aging +-----+---------------+---------+-----------+----------+--------------+ CFV  Full           Yes      Yes                                 +-----+---------------+---------+-----------+----------+--------------+   +---------+---------------+---------+-----------+----------+--------------+ LEFT     CompressibilityPhasicitySpontaneityPropertiesThrombus Aging +---------+---------------+---------+-----------+----------+--------------+ CFV      Full           Yes      Yes                                 +---------+---------------+---------+-----------+----------+--------------+ SFJ      Full                                                         +---------+---------------+---------+-----------+----------+--------------+ FV Prox  Full                                                        +---------+---------------+---------+-----------+----------+--------------+ FV Mid   Full                                                        +---------+---------------+---------+-----------+----------+--------------+ FV DistalFull                                                        +---------+---------------+---------+-----------+----------+--------------+ PFV      Full                                                        +---------+---------------+---------+-----------+----------+--------------+ POP      Full           Yes      Yes                                 +---------+---------------+---------+-----------+----------+--------------+ PTV      Full                                                        +---------+---------------+---------+-----------+----------+--------------+  PERO     Full                                                        +---------+---------------+---------+-----------+----------+--------------+    Summary: RIGHT: - No evidence of common femoral vein obstruction.  LEFT: - There is no evidence of deep vein thrombosis in the lower extremity. However, portions of this examination were limited- see technologist comments above.  - No cystic structure found in the popliteal fossa.  *See table(s) above for measurements and observations.    Preliminary     Procedures Procedures   Medications Ordered in ED Medications  furosemide (LASIX) injection 80 mg (80 mg Intravenous Given 12/25/20 0911)    ED Course  I have reviewed the triage vital signs and the nursing notes.  Pertinent labs & imaging results that were available during my care of the patient were reviewed by me and considered in my medical decision making (see chart for details).    MDM Rules/Calculators/A&P                            Patient presenting for evaluation of dyspnea on exertion and worsening leg swelling.  On exam, patient appears nontoxic.  He has significant swelling in both legs, most consistent with heart failure especially in the setting of diagnosis and not on any diuretics.  As the left leg is more swollen than the right and more painful, also consider DVT.  Less likely infection as patient is afebrile and without a cough, however will obtain chest x-ray to rule out pulmonary edema or infection.  Will check metabolic panel to assess kidney function and electrolytes.   Labs interpreted by me, overall reassuring.  Mild nonspecific leukocytosis of 10.  BNP is negative.  Electrolytes are stable.  Chest x-ray viewed and independently interpreted by me, no pneumonia pneumothorax, effusion.  EKG shows sinus tach.  Ultrasound negative for DVT.  On reevaluation, patient has good urine output.  Discussed findings with patient.  Discussed that his leg swelling is likely due to the fact that he is not taking his medications, will give his home medications.  Discussed with social work who will help with patient getting his medications this month as well as assist with getting him in with a primary care doctor.  Discussed plan and findings with patient, who is agreeable.  At this time, patient appears safe for discharge.  Return precautions given.  Patient states he understands and agrees to plan.   Final Clinical Impression(s) / ED Diagnoses Final diagnoses:  Heart failure, unspecified HF chronicity, unspecified heart failure type [facility])  Essential hypertension  Leg edema    Rx / DC Orders ED Discharge Orders          Ordered    atenolol (TENORMIN) 50 MG tablet  Daily,   Status:  Discontinued        12/25/20 1038    furosemide (LASIX) 80 MG tablet  Daily,   Status:  Discontinued        12/25/20 1038    spironolactone (ALDACTONE) 50 MG tablet  Daily,   Status:  Discontinued        12/25/20 1038    atenolol  (TENORMIN) 50 MG tablet  Daily  12/25/20 1046    furosemide (LASIX) 80 MG tablet  Daily        12/25/20 1046    spironolactone (ALDACTONE) 50 MG tablet  Daily        12/25/20 1046             Severy, [place], PA-C 12/25/20 1126    Terald Sleeper, MD 12/25/20 1704

## 2020-12-25 NOTE — ED Triage Notes (Signed)
Pt BIB EMS, pt complains of bilaterally leg pain with swelling x 2 hrs.

## 2021-08-15 ENCOUNTER — Ambulatory Visit: Payer: Self-pay | Admitting: Nurse Practitioner

## 2021-09-30 ENCOUNTER — Ambulatory Visit: Payer: Medicaid Other | Admitting: Family

## 2021-09-30 ENCOUNTER — Encounter: Payer: Self-pay | Admitting: Family

## 2021-09-30 VITALS — BP 140/80 | HR 59 | Temp 97.8°F | Resp 18 | Ht 73.0 in | Wt 344.6 lb

## 2021-09-30 DIAGNOSIS — E785 Hyperlipidemia, unspecified: Secondary | ICD-10-CM | POA: Diagnosis not present

## 2021-09-30 DIAGNOSIS — Z7689 Persons encountering health services in other specified circumstances: Secondary | ICD-10-CM

## 2021-09-30 DIAGNOSIS — M160 Bilateral primary osteoarthritis of hip: Secondary | ICD-10-CM | POA: Diagnosis not present

## 2021-09-30 DIAGNOSIS — I10 Essential (primary) hypertension: Secondary | ICD-10-CM

## 2021-09-30 DIAGNOSIS — I5032 Chronic diastolic (congestive) heart failure: Secondary | ICD-10-CM

## 2021-09-30 DIAGNOSIS — R2681 Unsteadiness on feet: Secondary | ICD-10-CM | POA: Diagnosis not present

## 2021-09-30 DIAGNOSIS — Z6841 Body Mass Index (BMI) 40.0 and over, adult: Secondary | ICD-10-CM

## 2021-09-30 LAB — GLUCOSE, POCT (MANUAL RESULT ENTRY): POC Glucose: 107 mg/dl — AB (ref 70–99)

## 2021-09-30 MED ORDER — MELOXICAM 7.5 MG PO TABS: 7.5000 mg | ORAL_TABLET | Freq: Every day | ORAL | 0 refills | Status: DC

## 2021-09-30 NOTE — Progress Notes (Signed)
Provider: Richarda Blade FNP-C   Deem Marmol, Donalee Citrin, NP  Patient Care Team: Tareka Jhaveri, Donalee Citrin, NP as PCP - General (Family Medicine) Eldred Manges, MD as Consulting Physician (Orthopedic Surgery)  Extended Emergency Contact Information Primary Emergency Contact: Feighner,Craig Mobile Phone: [phone] Relation: Brother Secondary Emergency Contact: McCleese,Elliott Mobile Phone: [phone] Relation: Nephew  Code Status:  Full Code  Goals of care: Advanced Directive information    09/30/2021   11:18 AM  Advanced Directives  Does Patient Have a Medical Advance Directive? No  Would patient like information on creating a medical advance directive? No - Patient declined     Chief Complaint  Patient presents with   Establish Care    New Patient.    HPI:  Pt is a 56 y.o. male seen today establish care here at [facility] for medical management of chronic diseases.States has not seen a provider for several years due to lack of insurance. Has a medical history of essential hypertension, congestive heart failure ECHO done 07/05/2019 showed moderate LV H with a normal EF 50 to 55% grade 1 diastolic dysfunction with normal LAP, bilateral lower extremity edema, osteoarthritis of the hip, morbid obesity among others.  He complains of worsening bilateral hip pain Cannot squad down or lift the legs.states having trouble sitting on the toilet at home since it's too low has difficulty getting up.  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  Currently on gabapentin but states ineffective and does not like side effects.He researched on the Internet and found out gabapentin joints the kidney.  He will require DME Power wheelchair to allow her to maintain current level of independence with ADL's which cannot be achieved with Scooter,Rolling walker or cane. Patient suffers from chronic Osteoarthritis of both hips, unsteady gait, which impairs his ability to perform  daily activities like bathing,walking,dressing,grooming and toileting in the home.  Also unable to self propel on a wheelchair or use a scooter. Has mental and physical ability to operate power wheelchair and willing to use in his home.  Also request elevated over the commode toilet seat   Of note, blood drawn patient became shaky.  Blood sugar checked was within normal range 104.  Stated has not eaten since the previous night.  Past Medical History:  Diagnosis Date   Back ache    Hip arthritis    Hypertension    Past Surgical History:  Procedure Laterality Date   APPENDECTOMY      Allergies  Allergen Reactions   Sulfa Antibiotics Rash    Allergies as of 09/30/2021       Reactions   Sulfa Antibiotics Rash        Medication List        Accurate as of September 30, 2021  8:40 PM. If you have any questions, ask your nurse or doctor.          STOP taking these medications    furosemide 80 MG tablet Commonly known as: LASIX Stopped by: Caesar Bookman, NP       TAKE these medications    acetaminophen 500 MG tablet Commonly known as: TYLENOL Take 500 mg by mouth 3 (three) times daily.   atenolol 50 MG tablet Commonly known as: TENORMIN Take 50 mg by mouth 2 (two) times daily. What changed: Another medication with the same name was removed. Continue taking this medication, and follow the directions you see here. Changed by: Caesar Bookman,  NP   gabapentin 300 MG capsule Commonly known as: NEURONTIN Take 300 mg by mouth 3 (three) times daily.   hydrochlorothiazide 25 MG tablet Commonly known as: HYDRODIURIL Take 25 mg by mouth daily.   meloxicam 7.5 MG tablet Commonly known as: MOBIC Take 1 tablet (7.5 mg total) by mouth daily. Started by: Caesar Bookman, NP   spironolactone 25 MG tablet Commonly known as: ALDACTONE Take 25 mg by mouth 2 (two) times daily. What changed: Another medication with the same name was removed. Continue taking this medication,  and follow the directions you see here. Changed by: Caesar Bookman, NP               Durable Medical Equipment  (From admission, onward)           Start     Ordered   09/30/21 0000  For home use only DME Other see comment       Comments: Over the toilet commode seat use daily over the toilet.  Question:  Length of Need  Answer:  Lifetime   09/30/21 1148   09/30/21 0000  For home use only DME Other see comment       Comments: Power wheelchair use daily for ambulation.  Question:  Length of Need  Answer:  Lifetime   09/30/21 1148            Review of Systems  Constitutional:  Negative for appetite change, chills, fatigue, fever and unexpected weight change.  HENT:  Negative for congestion, dental problem, ear discharge, ear pain, facial swelling, hearing loss, nosebleeds, postnasal drip, rhinorrhea, sinus pressure, sinus pain, sneezing, sore throat, tinnitus and trouble swallowing.   Eyes:  Negative for pain, discharge, redness, itching and visual disturbance.  Respiratory:  Negative for cough, chest tightness, shortness of breath and wheezing.   Cardiovascular:  Negative for chest pain, palpitations and leg swelling.  Gastrointestinal:  Negative for abdominal distention, abdominal pain, blood in stool, constipation, diarrhea, nausea and vomiting.  Endocrine: Negative for cold intolerance, heat intolerance, polydipsia, polyphagia and polyuria.  Genitourinary:  Negative for difficulty urinating, dysuria, flank pain, frequency and urgency.  Musculoskeletal:  Positive for arthralgias and gait problem. Negative for back pain, joint swelling, myalgias, neck pain and neck stiffness.       Bilateral hip pain   Skin:  Negative for color change, pallor, rash and wound.  Neurological:  Negative for dizziness, syncope, speech difficulty, weakness, light-headedness, numbness and headaches.  Hematological:  Does not bruise/bleed easily.  Psychiatric/Behavioral:  Negative for  agitation, behavioral problems, confusion, hallucinations, self-injury, sleep disturbance and suicidal ideas. The patient is not nervous/anxious.      There is no immunization history on file for this patient. Pertinent  Health Maintenance Due  Topic Date Due   COLONOSCOPY (Pts 45-51yrs Insurance coverage will need to be confirmed)  Never done   INFLUENZA VACCINE  11/04/2021      04/24/2019   10:23 AM 04/24/2019   10:24 AM 05/03/2019   12:09 PM 12/25/2020    3:42 AM 09/30/2021   11:18 AM  Fall Risk  Falls in the past year?     1  Was there an injury with Fall?     0  Fall Risk Category Calculator     2  Fall Risk Category     Moderate  Patient Fall Risk Level Low fall risk Moderate fall risk Moderate fall risk Low fall risk Moderate fall risk  Patient at Risk for Falls  Due to     History of fall(s);Impaired balance/gait;Impaired mobility  Fall risk Follow up     Falls evaluation completed;Education provided;Falls prevention discussed   Functional Status Survey:    Vitals:   09/30/21 1109  BP: 140/80  Pulse: (!) 59  Resp: 18  Temp: 97.8 F (36.6 C)  SpO2: 98%  Weight: (!) 344 lb 9.6 oz (156.3 kg)  Height: 6\' 1"  (1.854 m)   Body mass index is 45.46 kg/m. Physical Exam Vitals reviewed.  Constitutional:      General: He is not in acute distress.    Appearance: Normal appearance. He is obese. He is not ill-appearing or diaphoretic.     Comments: Standing in the room throughout the visit states unable to sit on the regular chairs due to chronic bilateral hip pain that radiates to the groin.Sweat running down his head on the face.Patient also saturated with sweat.  HENT:     Head: Normocephalic.     Right Ear: Tympanic membrane, ear canal and external ear normal. There is no impacted cerumen.     Left Ear: Tympanic membrane, ear canal and external ear normal. There is no impacted cerumen.     Nose: Nose normal. No congestion or rhinorrhea.     Mouth/Throat:     Mouth: Mucous  membranes are moist.     Pharynx: Oropharynx is clear. No oropharyngeal exudate or posterior oropharyngeal erythema.  Eyes:     General: No scleral icterus.       Right eye: No discharge.        Left eye: No discharge.     Extraocular Movements: Extraocular movements intact.     Conjunctiva/sclera: Conjunctivae normal.     Pupils: Pupils are equal, round, and reactive to light.  Neck:     Vascular: No carotid bruit.  Cardiovascular:     Rate and Rhythm: Normal rate and regular rhythm.     Pulses: Normal pulses.     Heart sounds: Normal heart sounds. No murmur heard.    No friction rub. No gallop.  Pulmonary:     Effort: Pulmonary effort is normal. No respiratory distress.     Breath sounds: Normal breath sounds. No wheezing, rhonchi or rales.  Chest:     Chest wall: No tenderness.  Abdominal:     General: Bowel sounds are normal. There is no distension.     Palpations: Abdomen is soft. There is no mass.     Tenderness: There is no abdominal tenderness. There is no right CVA tenderness, left CVA tenderness, guarding or rebound.  Musculoskeletal:        General: No swelling.     Cervical back: Normal range of motion. No rigidity or tenderness.     Right hip: Tenderness present. Decreased range of motion. Normal strength.     Left hip: Tenderness present. Decreased range of motion. Normal strength.     Right lower leg: No edema.     Left lower leg: No edema.  Lymphadenopathy:     Cervical: No cervical adenopathy.  Skin:    General: Skin is warm and dry.     Coloration: Skin is not pale.     Findings: No bruising, erythema, lesion or rash.  Neurological:     Mental Status: He is alert and oriented to person, place, and time.     Cranial Nerves: No cranial nerve deficit.     Sensory: No sensory deficit.     Motor: No weakness.  Coordination: Coordination normal.     Gait: Gait abnormal.     Comments: Ambulates with the front wheel walker on left hand and a cane on the right  hand.  Unable to lift feet off the floor.  Very high risk for fall  Psychiatric:        Mood and Affect: Mood normal.        Speech: Speech normal.        Behavior: Behavior normal.        Thought Content: Thought content normal.        Judgment: Judgment normal.     Labs reviewed: Recent Labs    12/25/20 0820  NA 142  K 4.0  CL 106  CO2 27  GLUCOSE 136*  BUN 16  CREATININE 1.16  CALCIUM 9.2   Recent Labs    12/25/20 0820  AST 76*  ALT 51*  ALKPHOS 86  BILITOT 1.0  PROT 8.1  ALBUMIN 3.8   Recent Labs    12/25/20 0731  WBC 10.8*  NEUTROABS 8.5*  HGB 14.1  HCT 46.6  MCV 81.8  PLT 243   No results found for: "TSH" No results found for: "HGBA1C" No results found for: "CHOL", "HDL", "LDLCALC", "LDLDIRECT", "TRIG", "CHOLHDL"  Significant Diagnostic Results in last 30 days:  No results found.  Assessment/Plan  1. Encounter to establish care Available medical records reviewed, due for tetanus, COVID 19 vaccine and Shingrix vaccine made aware to get vaccine at the pharmacy.Recommended fasting blood work  2. Primary osteoarthritis of both hips Worsening hip pain.Suspect morbid obesity worsening symptoms.currently on gabapentin but ineffective Start on Mobic -Advised to get bilateral hip x-ray at [facility] [address] - DG HIP UNILAT W OR W/O PELVIS 2-3 VIEWS RIGHT; Future - DG Hip Unilat W OR W/O Pelvis 2-3 Views Left; Future - Ambulatory referral to Orthopedic Surgery - meloxicam (MOBIC) 7.5 MG tablet; Take 1 tablet (7.5 mg total) by mouth daily.  Dispense: 30 tablet; Refill: 0 - For home use only DME Other see comment: [employer] DME Power wheelchair to allow her to maintain current level of independence with ADL's which cannot be achieved with Scooter,Rolling walker or cane. Patient suffers from chronic Osteoarthritis of both hips, unsteady gait, which impairs his ability to perform daily activities like bathing,walking,dressing,grooming and  toileting in the home.  Also unable to self propel on a wheelchair or use a scooter. Has mental and physical ability to operate power wheelchair and willing to use in his home.  - For home use only DME Other see comment: Over the toilets toilet sits to enable him to get in and off the toilet.  3. Unsteady gait Very high risk for falls due to bilateral hip pain - For home use only DME Other see comment; power wheelchair as above - For home use only DME Other see comment: Over the toilet commode - POC Glucose (CBG)  4. Hyperlipidemia LDL goal <100 No recent LDL for review -Dietary modification advised.  Patient unable to exercise due to bilateral hip pain secondary to osteoarthritis - Lipid Panel  5. Essential hypertension Blood pressure not at goal but in pain during visit -Continue on atenolol,spironolactone and hydrochlorothiazide - CBC with Differential/Platelet - CMP with eGFR([facility]) - TSH  6. Body mass index (BMI) of 45.0-49.9 in adult (HCC) BMI 45.46 Dietary modification advised.Exercise limited due to bilateral hip pain  7. Class 3 severe obesity with serious comorbidity and body mass index (BMI) of 45.0 to 49.9  in adult, unspecified obesity type (HCC) Morbid obesity with worsening osteoarthritis.  Unable to exercise due to bilateral hip pain.  Had plans for surgery in the past but was not done due to lack of insurance.  Would like referral to orthopedic since he has insurance now.  8. Congestive heart failure with left ventricular diastolic dysfunction, chronic (HCC) No symptoms of fluid overload.  Currently off furosemide -Continue on spironolactone hydrochlorothiazide and atenolol -Continue to monitor weight -Advised to Salt intake in diet to prevent fluid retention  Family/ staff Communication: Reviewed plan of care with patient verbalized understanding   Labs/tests ordered:  - CBC with Differential/Platelet - CMP with eGFR([facility]) - TSH - Lipid panel  Next  Appointment : Return in about 6 months (around 04/01/2022) for medical mangement of chronic issues.Caesar Bookman, NP

## 2021-10-01 ENCOUNTER — Other Ambulatory Visit: Payer: Self-pay

## 2021-10-01 MED ORDER — ATENOLOL 50 MG PO TABS: 50.0000 mg | ORAL_TABLET | Freq: Two times a day (BID) | ORAL | 2 refills | Status: AC

## 2021-10-01 MED ORDER — SPIRONOLACTONE 25 MG PO TABS: 25.0000 mg | ORAL_TABLET | Freq: Two times a day (BID) | ORAL | 2 refills | Status: AC

## 2021-10-01 MED ORDER — HYDROCHLOROTHIAZIDE 25 MG PO TABS: 25.0000 mg | ORAL_TABLET | Freq: Every day | ORAL | 2 refills | Status: AC

## 2021-10-01 MED ORDER — GABAPENTIN 300 MG PO CAPS: 300.0000 mg | ORAL_CAPSULE | Freq: Three times a day (TID) | ORAL | 1 refills | Status: DC

## 2021-10-01 NOTE — Telephone Encounter (Signed)
Patient called requesting a refill on his Atenolol, Gabapentin,HCTZ, Spironolactone. Medications never filled by Carilyn Goodpasture Ngetich,NP

## 2021-10-02 LAB — COMPLETE METABOLIC PANEL WITH GFR
AG Ratio: 1.2 (calc) (ref 1.0–2.5)
ALT: 29 U/L (ref 9–46)
AST: 34 U/L (ref 10–35)
Albumin: 4.8 g/dL (ref 3.6–5.1)
Alkaline phosphatase (APISO): 102 U/L (ref 35–144)
BUN: 20 mg/dL (ref 7–25)
CO2: 20 mmol/L (ref 20–32)
Calcium: 9.7 mg/dL (ref 8.6–10.3)
Chloride: 101 mmol/L (ref 98–110)
Creat: 1.09 mg/dL (ref 0.70–1.30)
Globulin: 3.9 g/dL (calc) — ABNORMAL HIGH (ref 1.9–3.7)
Glucose, Bld: 103 mg/dL — ABNORMAL HIGH (ref 65–99)
Potassium: 3.6 mmol/L (ref 3.5–5.3)
Sodium: 141 mmol/L (ref 135–146)
Total Bilirubin: 0.6 mg/dL (ref 0.2–1.2)
Total Protein: 8.7 g/dL — ABNORMAL HIGH (ref 6.1–8.1)
eGFR: 80 mL/min/{1.73_m2} (ref >=60)

## 2021-10-02 LAB — CBC WITH DIFFERENTIAL/PLATELET
Absolute Monocytes: 856 cells/uL (ref 200–950)
Basophils Absolute: 43 cells/uL (ref 0–200)
Basophils Relative: 0.4 %
Eosinophils Absolute: 75 cells/uL (ref 15–500)
Eosinophils Relative: 0.7 %
HCT: 52.4 % — ABNORMAL HIGH (ref 38.5–50.0)
Hemoglobin: 15.9 g/dL (ref 13.2–17.1)
Lymphs Abs: 3328 cells/uL (ref 850–3900)
MCH: 23.7 pg — ABNORMAL LOW (ref 27.0–33.0)
MCHC: 30.3 g/dL — ABNORMAL LOW (ref 32.0–36.0)
MCV: 78.1 fL — ABNORMAL LOW (ref 80.0–100.0)
MPV: 11.7 fL (ref 7.5–12.5)
Monocytes Relative: 8 %
Neutro Abs: 6399 cells/uL (ref 1500–7800)
Neutrophils Relative %: 59.8 %
Platelets: 294 10*3/uL (ref 140–400)
RBC: 6.71 10*6/uL — ABNORMAL HIGH (ref 4.20–5.80)
RDW: 13.5 % (ref 11.0–15.0)
Total Lymphocyte: 31.1 %
WBC: 10.7 10*3/uL (ref 3.8–10.8)

## 2021-10-02 LAB — TEST AUTHORIZATION

## 2021-10-02 LAB — TSH: TSH: 7.45 mIU/L — ABNORMAL HIGH (ref 0.40–4.50)

## 2021-10-02 LAB — HEMOGLOBIN A1C
Hgb A1c MFr Bld: 5.6 % of total Hgb (ref <5.7)
Mean Plasma Glucose: 114 mg/dL
eAG (mmol/L): 6.3 mmol/L

## 2021-10-02 LAB — LIPID PANEL
Cholesterol: 162 mg/dL (ref <200)
HDL: 47 mg/dL (ref >=40)
LDL Cholesterol (Calc): 91 mg/dL (calc)
Non-HDL Cholesterol (Calc): 115 mg/dL (calc) (ref <130)
Total CHOL/HDL Ratio: 3.4 (calc) (ref <5.0)
Triglycerides: 143 mg/dL (ref <150)

## 2021-10-02 LAB — T3, FREE: T3, Free: 3.2 pg/mL (ref 2.3–4.2)

## 2021-10-02 LAB — T4, FREE: Free T4: 1.2 ng/dL (ref 0.8–1.8)

## 2021-10-08 ENCOUNTER — Ambulatory Visit (INDEPENDENT_AMBULATORY_CARE_PROVIDER_SITE_OTHER): Payer: Medicaid Other | Admitting: Orthopaedic Surgery

## 2021-10-08 ENCOUNTER — Ambulatory Visit: Payer: Self-pay

## 2021-10-08 VITALS — BP 152/105 | HR 101 | Wt 340.0 lb

## 2021-10-08 DIAGNOSIS — M25552 Pain in left hip: Secondary | ICD-10-CM

## 2021-10-08 DIAGNOSIS — M160 Bilateral primary osteoarthritis of hip: Secondary | ICD-10-CM

## 2021-10-08 DIAGNOSIS — M25551 Pain in right hip: Secondary | ICD-10-CM | POA: Diagnosis not present

## 2021-10-08 NOTE — Progress Notes (Unsigned)
Office Visit Note   Patient: Jerry Jennings           Date of Birth: June 26, 1965           MRN: [id number] Visit Date: 10/08/2021              Requested by: Caesar Bookman, NP [address] [place],  [place] [address] PCP: Caesar Bookman, NP   Assessment & Plan: Visit Diagnoses:  1. Bilateral hip pain   2. Bilateral primary osteoarthritis of hip     Plan: Patient work on weight loss.  Goal is 303 pounds.  He will return in 4 months we will weigh patient at that time.  He has been able lose weight in the past.  Follow-Up Instructions: Return in about 4 months (around 02/08/2022).   Orders:  Orders Placed This Encounter  Procedures   XR HIPS BILAT W OR W/O PELVIS 2V   No orders of the defined types were placed in this encounter.     Procedures: No procedures performed   Clinical Data: No additional findings.   Subjective: Chief Complaint  Patient presents with   Left Hip - Pain    HPI 56 year old male morbidly obese with recurrent visit for severe hip osteoarthritis both right and left hip now.  He states his hips hurt so bad he cannot lift his foot up enough to go up a step.  He finally has a PCP and is getting treated for fluid retention hypertension.  He did not take his BP medicine this morning and blood pressure is 150/105.  He does have heart failure with left ventricular dysfunction bilateral edema improved from last visit.  Current weight 340 goal weight is 303 to get his BMI below 40.  At 1 point he rates 370.  Review of Systems positive for history of heart failure bilateral hip osteoarthritis.   Objective: Vital Signs: BP (!) 152/105   Pulse (!) 101   Wt (!) 340 lb (154.2 kg)   BMI 44.86 kg/m   Physical Exam Constitutional:      Appearance: He is well-developed.  HENT:     Head: Normocephalic and atraumatic.     Right Ear: External ear normal.     Left Ear: External ear normal.  Eyes:     Pupils: Pupils are equal, round, and reactive to light.   Neck:     Thyroid: No thyromegaly.     Trachea: No tracheal deviation.  Cardiovascular:     Rate and Rhythm: Normal rate.  Pulmonary:     Effort: Pulmonary effort is normal.     Breath sounds: No wheezing.  Abdominal:     General: Bowel sounds are normal.     Palpations: Abdomen is soft.  Musculoskeletal:     Cervical back: Neck supple.  Skin:    General: Skin is warm and dry.     Capillary Refill: Capillary refill takes less than 2 seconds.  Neurological:     Mental Status: He is alert and oriented to person, place, and time.  Psychiatric:        Behavior: Behavior normal.        Thought Content: Thought content normal.        Judgment: Judgment normal.     Ortho Exam patient has less than 10 degrees internal/external rotation both hips with severe pain external rotation 2030 degrees.  He has pain with hip flexion and has minimal hip extension.  Good knee range of motion without  knee effusion.  Specialty Comments:  No specialty comments available.  Imaging: No results found.   PMFS History: Patient Active Problem List   Diagnosis Date Noted   Bilateral primary osteoarthritis of hip 10/09/2021   Congestive heart failure with left ventricular diastolic dysfunction, chronic (HCC) 09/30/2021   Preop cardiovascular exam 07/10/2019   Essential hypertension 06/23/2019   Leg edema 06/23/2019   Past Medical History:  Diagnosis Date   Back ache    Hip arthritis    Hypertension     Family History  Problem Relation Age of Onset   Heart attack Father    Diabetes Father    Cancer Mother    Stroke Sister    Hypertension Sister    Prostate cancer Brother     Past Surgical History:  Procedure Laterality Date   APPENDECTOMY     Social History   Occupational History   Not on file  Tobacco Use   Smoking status: Never   Smokeless tobacco: Never  Vaping Use   Vaping Use: Never used  Substance and Sexual Activity   Alcohol use: Not Currently   Drug use: No    Sexual activity: Not on file

## 2021-10-09 DIAGNOSIS — M160 Bilateral primary osteoarthritis of hip: Secondary | ICD-10-CM | POA: Insufficient documentation

## 2021-12-01 ENCOUNTER — Telehealth: Payer: Self-pay

## 2021-12-01 NOTE — Telephone Encounter (Signed)
Please notify patient and provide number to call [employer].

## 2021-12-01 NOTE — Telephone Encounter (Signed)
Nesha with [employer] called and they have not been able to reach patient or emergency contact. Since they have not been able to reach patient they are closing out referral for wheelchair order.  Message routed to Richarda Blade, NP

## 2021-12-03 NOTE — Telephone Encounter (Signed)
Tried calling patient and message said call could not be completed.

## 2022-01-02 ENCOUNTER — Other Ambulatory Visit: Payer: Self-pay | Admitting: Family

## 2022-01-02 DIAGNOSIS — M160 Bilateral primary osteoarthritis of hip: Secondary | ICD-10-CM

## 2022-04-01 ENCOUNTER — Encounter: Payer: Medicaid Other | Admitting: Family

## 2022-04-01 NOTE — Progress Notes (Signed)
  This encounter was created in error - please disregard. No show 

## 2022-04-10 NOTE — Telephone Encounter (Signed)
Patient called and LMOM that he has been trying to get his wheelchair for 6 months now and haven't heard anything.   Gave him the message that [facility] has been trying to contact him.  Number given to patient to [employer]. He stated that he will give them a call.

## 2022-04-15 ENCOUNTER — Encounter: Payer: Medicaid Other | Admitting: Family

## 2022-04-22 ENCOUNTER — Encounter: Payer: Self-pay | Admitting: Family

## 2022-04-22 ENCOUNTER — Ambulatory Visit (INDEPENDENT_AMBULATORY_CARE_PROVIDER_SITE_OTHER): Payer: Medicaid Other | Admitting: Family

## 2022-04-22 VITALS — BP 170/70 | HR 103 | Temp 96.2°F | Resp 18 | Ht 73.0 in | Wt 345.0 lb

## 2022-04-22 DIAGNOSIS — I5032 Chronic diastolic (congestive) heart failure: Secondary | ICD-10-CM

## 2022-04-22 DIAGNOSIS — M160 Bilateral primary osteoarthritis of hip: Secondary | ICD-10-CM | POA: Diagnosis not present

## 2022-04-22 DIAGNOSIS — E785 Hyperlipidemia, unspecified: Secondary | ICD-10-CM

## 2022-04-22 DIAGNOSIS — Z1211 Encounter for screening for malignant neoplasm of colon: Secondary | ICD-10-CM

## 2022-04-22 DIAGNOSIS — L602 Onychogryphosis: Secondary | ICD-10-CM

## 2022-04-22 DIAGNOSIS — I10 Essential (primary) hypertension: Secondary | ICD-10-CM | POA: Diagnosis not present

## 2022-04-22 MED ORDER — DICLOFENAC SODIUM 75 MG PO TBEC: 75.0000 mg | DELAYED_RELEASE_TABLET | Freq: Three times a day (TID) | ORAL | 3 refills | Status: AC

## 2022-04-22 NOTE — Progress Notes (Signed)
Provider: Richarda Blade FNP-C  Tyron Manetta, Donalee Citrin, NP  Patient Care Team: Malachi Kinzler, Donalee Citrin, NP as PCP - General (Family Medicine) Eldred Manges, MD as Consulting Physician (Orthopedic Surgery)  Extended Emergency Contact Information Primary Emergency Contact: Ivan,Craig Mobile Phone: [phone] Relation: Brother Secondary Emergency Contact: McCleese,Elliott Mobile Phone: [phone] Relation: Nephew  Code Status:  Full Code  Goals of care: Advanced Directive information    04/22/2022    2:50 PM  Advanced Directives  Does Patient Have a Medical Advance Directive? No  Would patient like information on creating a medical advance directive? No - Patient declined     Chief Complaint  Patient presents with   Medical Management of Chronic Issues    6 month follow up.   Health Maintenance    Discuss the need for HIV Screening, Hepatitis C Screening, and Colonoscopy.    Immunizations    Discuss the need for Covid vaccine, Shingrix vaccine, Dtap vaccine, and Influenza vaccine.    Concern     Patient complains of Gabapentin and Meloxicam dont work well. Patient complains of still being in PAIN!    HPI:  Pt is a 57 y.o. male seen today for 6 months follow-up for medical management of chronic issues.  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 States previous prescribed gabapentin and meloxicam for bilateral hip osteoarthritis does not help with the pain.  State was seen by orthopedic specialist was advised to lose weight prior to going for hip replacement. Patient states unable to lose weight since he cannot exercise due to pain. Water aerobic exercise or swimming recommend but declines states has no transportation and does not want to stand or prolong period of time waiting for the bus. He requests a different pain medication.States unable to sit, has been standing and during exam today. He declines all vaccines COVID 19, Shingrix and influenza vaccine.  States he might of had his tetanus  shot but willing to get it at pharmacy if he has not gotten it. Will get lab work next visit. He is due for colonoscopy screening.Denies any symptoms.  Cologuard versus colonoscopy option discussed.states prefers Cologuard. States was told by [employer] company that power wheelchair was not ordered by provider.on chart review,power wheelchair was ordered when patient came to establish care in June 27,2023.Telephone call notes reviewed indicates [employer] tried to contact patient but did not answer his phone.[facility] staff gave patient number to call [employer].     Past Medical History:  Diagnosis Date   Back ache    Hip arthritis    Hypertension    Past Surgical History:  Procedure Laterality Date   APPENDECTOMY      Allergies  Allergen Reactions   Sulfa Antibiotics Rash    Outpatient Encounter Medications as of 04/22/2022  Medication Sig   acetaminophen (TYLENOL) 500 MG tablet Take 500 mg by mouth 3 (three) times daily.   atenolol (TENORMIN) 50 MG tablet Take 1 tablet (50 mg total) by mouth 2 (two) times daily.   hydrochlorothiazide (HYDRODIURIL) 25 MG tablet Take 1 tablet (25 mg total) by mouth daily.   spironolactone (ALDACTONE) 25 MG tablet Take 1 tablet (25 mg total) by mouth 2 (two) times daily.   [DISCONTINUED] gabapentin (NEURONTIN) 300 MG capsule Take 1 capsule (300 mg total) by mouth 3 (three) times daily.   [DISCONTINUED] meloxicam (MOBIC) 7.5 MG tablet TAKE 1 TABLET(7.5 MG) BY MOUTH DAILY   No facility-administered encounter medications on file as of 04/22/2022.    Review  of Systems  Constitutional:  Negative for appetite change, chills, fatigue, fever and unexpected weight change.  HENT:  Negative for congestion, dental problem, ear discharge, ear pain, facial swelling, hearing loss, nosebleeds, postnasal drip, rhinorrhea, sinus pressure, sinus pain, sneezing, sore throat, tinnitus and trouble swallowing.   Eyes:  Negative for pain, discharge, redness, itching and visual  disturbance.  Respiratory:  Negative for cough, chest tightness, shortness of breath and wheezing.   Cardiovascular:  Negative for chest pain, palpitations and leg swelling.  Gastrointestinal:  Negative for abdominal distention, abdominal pain, blood in stool, constipation, diarrhea, nausea and vomiting.  Endocrine: Negative for cold intolerance, heat intolerance, polydipsia, polyphagia and polyuria.  Genitourinary:  Negative for difficulty urinating, dysuria, flank pain, frequency and urgency.  Musculoskeletal:  Positive for arthralgias and gait problem. Negative for back pain, joint swelling, myalgias, neck pain and neck stiffness.  Skin:  Negative for color change, pallor, rash and wound.  Neurological:  Negative for dizziness, syncope, speech difficulty, weakness, light-headedness, numbness and headaches.  Hematological:  Does not bruise/bleed easily.  Psychiatric/Behavioral:  Negative for agitation, behavioral problems, confusion, hallucinations, self-injury, sleep disturbance and suicidal ideas. The patient is not nervous/anxious.      There is no immunization history on file for this patient. Pertinent  Health Maintenance Due  Topic Date Due   COLONOSCOPY (Pts 45-59yrs Insurance coverage will need to be confirmed)  Never done   INFLUENZA VACCINE  Never done      04/24/2019   10:24 AM 05/03/2019   12:09 PM 12/25/2020    3:42 AM 09/30/2021   11:18 AM 04/22/2022    2:49 PM  [place] in the past year?    1 1  Was there an injury with Fall?    0 0  Fall Risk Category Calculator    2 1  Fall Risk Category (Retired)    Moderate   (RETIRED) Patient Fall Risk Level Moderate fall risk Moderate fall risk Low fall risk Moderate fall risk   Patient at Risk for Falls Due to    History of fall(s);Impaired balance/gait;Impaired mobility History of fall(s)  Fall risk Follow up    Falls evaluation completed;Education provided;Falls prevention discussed Falls evaluation completed;Education  provided;Falls prevention discussed   Functional Status Survey:    Vitals:   04/22/22 1446  BP: (!) 170/70  Pulse: (!) 103  Resp: 18  Temp: (!) 96.2 F (35.7 C)  SpO2: 95%  Weight: (!) 345 lb (156.5 kg)  Height: 6\' 1"  (1.854 m)   Body mass index is 45.52 kg/m. Physical Exam Vitals reviewed.  Constitutional:      General: He is not in acute distress.    Appearance: Normal appearance. He is normal weight. He is not ill-appearing or diaphoretic.  HENT:     Head: Normocephalic.     Nose: Nose normal. No congestion or rhinorrhea.     Mouth/Throat:     Mouth: Mucous membranes are moist.     Pharynx: Oropharynx is clear. No oropharyngeal exudate or posterior oropharyngeal erythema.  Eyes:     General: No scleral icterus.       Right eye: No discharge.        Left eye: No discharge.     Conjunctiva/sclera: Conjunctivae normal.     Pupils: Pupils are equal, round, and reactive to light.  Neck:     Vascular: No carotid bruit.  Cardiovascular:     Rate and Rhythm: Normal rate and regular rhythm.  Pulses: Normal pulses.     Heart sounds: Normal heart sounds. No murmur heard.    No friction rub. No gallop.  Pulmonary:     Effort: Pulmonary effort is normal. No respiratory distress.     Breath sounds: Normal breath sounds. No wheezing, rhonchi or rales.  Chest:     Chest wall: No tenderness.  Abdominal:     General: Bowel sounds are normal. There is no distension.     Palpations: Abdomen is soft. There is no mass.     Tenderness: There is no abdominal tenderness. There is no right CVA tenderness, left CVA tenderness, guarding or rebound.  Musculoskeletal:        General: No swelling. Normal range of motion.     Cervical back: Normal range of motion. No rigidity or tenderness.     Right hip: Tenderness present. No deformity. Normal strength.     Left hip: Tenderness present. No deformity. Normal strength.     Right lower leg: No edema.     Left lower leg: No edema.   Feet:     Right foot:     Toenail Condition: Right toenails are abnormally thick and long.     Left foot:     Toenail Condition: Left toenails are abnormally thick and long.     Comments: Ambulates with a cane and Rolling walker. Lymphadenopathy:     Cervical: No cervical adenopathy.  Skin:    General: Skin is warm and dry.     Coloration: Skin is not pale.     Findings: No bruising, erythema, lesion or rash.  Neurological:     Mental Status: He is alert and oriented to person, place, and time.     Cranial Nerves: No cranial nerve deficit.     Sensory: No sensory deficit.     Motor: No weakness.     Coordination: Coordination normal.     Gait: Gait abnormal.  Psychiatric:        Mood and Affect: Mood normal.        Speech: Speech normal.        Behavior: Behavior normal.        Thought Content: Thought content normal.        Judgment: Judgment normal.     Labs reviewed: Recent Labs    09/30/21 1204  NA 141  K 3.6  CL 101  CO2 20  GLUCOSE 103*  BUN 20  CREATININE 1.09  CALCIUM 9.7   Recent Labs    09/30/21 1204  AST 34  ALT 29  BILITOT 0.6  PROT 8.7*   Recent Labs    09/30/21 1204  WBC 10.7  NEUTROABS 6,399  HGB 15.9  HCT 52.4*  MCV 78.1*  PLT 294   Lab Results  Component Value Date   TSH 7.45 (H) 09/30/2021   Lab Results  Component Value Date   HGBA1C 5.6 09/30/2021   Lab Results  Component Value Date   CHOL 162 09/30/2021   HDL 47 09/30/2021   LDLCALC 91 09/30/2021   TRIG 143 09/30/2021   CHOLHDL 3.4 09/30/2021    Significant Diagnostic Results in last 30 days:  No results found.  Assessment/Plan 1. Essential hypertension .  Blood pressure high suspect due to pain Advised to continue to monitor blood pressure at home notify provider if greater than 140/90 Continue on hydrochlorothiazide, atenolol,spironolactone - TSH - COMPLETE METABOLIC PANEL WITH GFR - CBC with Differential/Platelet  2. Primary osteoarthritis of both  hips  Reports gabapentin and meloxicam ineffective Repeat blood close advised to lose weight prior to surgery but pain limits exercise. Start onDiclofenac Below side effects discussed - diclofenac (VOLTAREN) 75 MG EC tablet; Take 1 tablet (75 mg total) by mouth 3 (three) times daily.  Dispense: 30 tablet; Refill: 3 - Ambulatory referral to Pain Clinic for further evaluation of pain   3. Hyperlipidemia LDL goal <100 Previous LDL at goal Dietary modification and exercise such as water aerobics recommended. - Lipid panel  4. Congestive heart failure with left ventricular diastolic dysfunction, chronic (HCC) No signs of fluid overload -Continue on spironolactone Continue to monitor  5. Colon cancer screening Asymptomatic Colonoscopy versus Cologuard reviewed and discussed patient prefers Cologuard. - Cologuard  6. Overgrown toenails Overgrown toenails unable to bend Will refer to podiatrist for further evaluation - Ambulatory referral to Podiatry  Family/ staff Communication: Reviewed plan of care with patient and case manager verbalized understanding  Labs/tests ordered:  - Cologuard - CBC with Differential/Platelet - CMP with eGFR([facility]) - TSH - Hgb A1C - Lipid panel  Next Appointment: Return in about 6 months (around 10/21/2022) for medical mangement of chronic issues.Sandrea Hughs, NP

## 2022-04-23 LAB — COMPLETE METABOLIC PANEL WITH GFR
AG Ratio: 1.3 (calc) (ref 1.0–2.5)
ALT: 29 U/L (ref 9–46)
AST: 28 U/L (ref 10–35)
Albumin: 4.2 g/dL (ref 3.6–5.1)
Alkaline phosphatase (APISO): 110 U/L (ref 35–144)
BUN: 23 mg/dL (ref 7–25)
CO2: 25 mmol/L (ref 20–32)
Calcium: 8.6 mg/dL (ref 8.6–10.3)
Chloride: 106 mmol/L (ref 98–110)
Creat: 0.76 mg/dL (ref 0.70–1.30)
Globulin: 3.3 g/dL (calc) (ref 1.9–3.7)
Glucose, Bld: 86 mg/dL (ref 65–139)
Potassium: 3.8 mmol/L (ref 3.5–5.3)
Sodium: 142 mmol/L (ref 135–146)
Total Bilirubin: 0.4 mg/dL (ref 0.2–1.2)
Total Protein: 7.5 g/dL (ref 6.1–8.1)
eGFR: 105 mL/min/{1.73_m2} (ref >=60)

## 2022-04-23 LAB — CBC WITH DIFFERENTIAL/PLATELET
Absolute Monocytes: 676 cells/uL (ref 200–950)
Basophils Absolute: 29 cells/uL (ref 0–200)
Basophils Relative: 0.3 %
Eosinophils Absolute: 147 cells/uL (ref 15–500)
Eosinophils Relative: 1.5 %
HCT: 45.7 % (ref 38.5–50.0)
Hemoglobin: 14.5 g/dL (ref 13.2–17.1)
Lymphs Abs: 1842 cells/uL (ref 850–3900)
MCH: 24.5 pg — ABNORMAL LOW (ref 27.0–33.0)
MCHC: 31.7 g/dL — ABNORMAL LOW (ref 32.0–36.0)
MCV: 77.2 fL — ABNORMAL LOW (ref 80.0–100.0)
MPV: 12 fL (ref 7.5–12.5)
Monocytes Relative: 6.9 %
Neutro Abs: 7105 cells/uL (ref 1500–7800)
Neutrophils Relative %: 72.5 %
Platelets: 316 10*3/uL (ref 140–400)
RBC: 5.92 10*6/uL — ABNORMAL HIGH (ref 4.20–5.80)
RDW: 12.3 % (ref 11.0–15.0)
Total Lymphocyte: 18.8 %
WBC: 9.8 10*3/uL (ref 3.8–10.8)

## 2022-04-23 LAB — LIPID PANEL
Cholesterol: 153 mg/dL (ref <200)
HDL: 51 mg/dL (ref >=40)
LDL Cholesterol (Calc): 79 mg/dL (calc)
Non-HDL Cholesterol (Calc): 102 mg/dL (calc) (ref <130)
Total CHOL/HDL Ratio: 3 (calc) (ref <5.0)
Triglycerides: 134 mg/dL (ref <150)

## 2022-04-23 LAB — TSH: TSH: 1.91 mIU/L (ref 0.40–4.50)

## 2022-04-28 ENCOUNTER — Other Ambulatory Visit: Payer: Self-pay

## 2022-05-14 ENCOUNTER — Ambulatory Visit (INDEPENDENT_AMBULATORY_CARE_PROVIDER_SITE_OTHER): Payer: Medicaid Other | Admitting: Podiatry

## 2022-05-14 DIAGNOSIS — Z91199 Patient's noncompliance with other medical treatment and regimen due to unspecified reason: Secondary | ICD-10-CM

## 2022-05-14 NOTE — Progress Notes (Signed)
Pt was a no show for apt, no charge 

## 2022-05-29 ENCOUNTER — Other Ambulatory Visit: Payer: Self-pay | Admitting: Family

## 2022-05-29 NOTE — Telephone Encounter (Signed)
Patient has request refill on medication Gabapentin. Medication is NOT on patient medications list. Medication pend and sent to Seth Bake, NP.

## 2022-06-08 ENCOUNTER — Telehealth: Payer: Self-pay

## 2022-06-08 NOTE — Telephone Encounter (Signed)
Please verify Gabapentin dosage.Recommend also lidocaine patch one application daily until can be seen by pain clinic.

## 2022-06-08 NOTE — Telephone Encounter (Signed)
Patient called stating that he is having pain in arms and knees. He states that his insurance doesn't cover the voltaren 75 mg tablet it cost $29. He would also like to have gabapentin restarted along with something else for the pain. He was unable to go to pain clinic due to lack of transportation.  Message routed to Marlowe Sax, NP

## 2022-06-09 NOTE — Telephone Encounter (Signed)
I called and left message for patient to call office.

## 2022-06-13 ENCOUNTER — Other Ambulatory Visit: Payer: Self-pay

## 2022-06-13 ENCOUNTER — Emergency Department (HOSPITAL_COMMUNITY): Payer: Medicaid Other

## 2022-06-13 ENCOUNTER — Emergency Department (HOSPITAL_COMMUNITY)
Admission: EM | Admit: 2022-06-13 | Discharge: 2022-06-13 | Disposition: A | Discharge status: Home / Self Care | Payer: Medicaid Other | Attending: Emergency Medicine | Admitting: Emergency Medicine

## 2022-06-13 DIAGNOSIS — M1610 Unilateral primary osteoarthritis, unspecified hip: Secondary | ICD-10-CM | POA: Insufficient documentation

## 2022-06-13 DIAGNOSIS — I110 Hypertensive heart disease with heart failure: Secondary | ICD-10-CM | POA: Insufficient documentation

## 2022-06-13 DIAGNOSIS — I509 Heart failure, unspecified: Secondary | ICD-10-CM | POA: Diagnosis not present

## 2022-06-13 DIAGNOSIS — R600 Localized edema: Secondary | ICD-10-CM | POA: Insufficient documentation

## 2022-06-13 DIAGNOSIS — M7989 Other specified soft tissue disorders: Secondary | ICD-10-CM | POA: Diagnosis present

## 2022-06-13 LAB — COMPREHENSIVE METABOLIC PANEL
ALT: 33 U/L (ref 0–44)
AST: 42 U/L — ABNORMAL HIGH (ref 15–41)
Albumin: 3.2 g/dL — ABNORMAL LOW (ref 3.5–5.0)
Alkaline Phosphatase: 81 U/L (ref 38–126)
Anion gap: 8 (ref 5–15)
BUN: 21 mg/dL — ABNORMAL HIGH (ref 6–20)
CO2: 26 mmol/L (ref 22–32)
Calcium: 8 mg/dL — ABNORMAL LOW (ref 8.9–10.3)
Chloride: 99 mmol/L (ref 98–111)
Creatinine, Ser: 0.76 mg/dL (ref 0.61–1.24)
GFR, Estimated: >60 mL/min (ref >60)
Glucose, Bld: 114 mg/dL — ABNORMAL HIGH (ref 70–99)
Potassium: 3.6 mmol/L (ref 3.5–5.1)
Sodium: 133 mmol/L — ABNORMAL LOW (ref 135–145)
Total Bilirubin: 0.5 mg/dL (ref 0.3–1.2)
Total Protein: 6.8 g/dL (ref 6.5–8.1)

## 2022-06-13 LAB — URINALYSIS, ROUTINE W REFLEX MICROSCOPIC
Bilirubin Urine: NEGATIVE
Glucose, UA: NEGATIVE mg/dL
Hgb urine dipstick: NEGATIVE
Ketones, ur: NEGATIVE mg/dL
Nitrite: NEGATIVE
Protein, ur: NEGATIVE mg/dL
Specific Gravity, Urine: 1.015 (ref 1.005–1.030)
pH: 5 (ref 5.0–8.0)

## 2022-06-13 LAB — CBC WITH DIFFERENTIAL/PLATELET
Abs Immature Granulocytes: 0.06 10*3/uL (ref 0.00–0.07)
Basophils Absolute: 0 10*3/uL (ref 0.0–0.1)
Basophils Relative: 0 %
Eosinophils Absolute: 0.2 10*3/uL (ref 0.0–0.5)
Eosinophils Relative: 3 %
HCT: 42.7 % (ref 39.0–52.0)
Hemoglobin: 12.8 g/dL — ABNORMAL LOW (ref 13.0–17.0)
Immature Granulocytes: 1 %
Lymphocytes Relative: 20 %
Lymphs Abs: 1.5 10*3/uL (ref 0.7–4.0)
MCH: 24.4 pg — ABNORMAL LOW (ref 26.0–34.0)
MCHC: 30 g/dL (ref 30.0–36.0)
MCV: 81.5 fL (ref 80.0–100.0)
Monocytes Absolute: 0.6 10*3/uL (ref 0.1–1.0)
Monocytes Relative: 8 %
Neutro Abs: 5.2 10*3/uL (ref 1.7–7.7)
Neutrophils Relative %: 68 %
Platelets: 233 10*3/uL (ref 150–400)
RBC: 5.24 MIL/uL (ref 4.22–5.81)
RDW: 13.1 % (ref 11.5–15.5)
WBC: 7.6 10*3/uL (ref 4.0–10.5)
nRBC: 0 % (ref 0.0–0.2)

## 2022-06-13 LAB — BRAIN NATRIURETIC PEPTIDE: B Natriuretic Peptide: 36.1 pg/mL (ref 0.0–100.0)

## 2022-06-13 MED ORDER — FUROSEMIDE 10 MG/ML IJ SOLN
40.0000 mg | Freq: Once | INTRAMUSCULAR | Status: AC
  Administered 2022-06-13: 40 mg via INTRAVENOUS
  Filled 2022-06-13: qty 4

## 2022-06-13 MED ORDER — KETOROLAC TROMETHAMINE 15 MG/ML IJ SOLN
15.0000 mg | Freq: Once | INTRAMUSCULAR | Status: AC
  Administered 2022-06-13: 15 mg via INTRAVENOUS
  Filled 2022-06-13: qty 1

## 2022-06-13 NOTE — ED Provider Notes (Signed)
[place] EMERGENCY DEPARTMENT AT [facility] Provider Note   CSN: [id number] Arrival date & time: 06/13/22  [id number]     History  Chief Complaint  Patient presents with   Leg Swelling    Jerry Jennings is a 57 y.o. male. Patient presents to the emergency department complaining of extremity edema, bilateral hip right, and right elbow pain.  The patient states that he has required Lasix in the past but that it "made him too dry" and the medication was discontinued.  He endorses being homeless and sleeping on chronically.  He states he has bad arthritis in both hips and is unable to sit up straight due to the pain.  He states that people to help him off the ground due to the arthritis.  He does endorse a recent fall approximately 3 days ago reveals that he may have injured his right elbow.  He is complaining of right elbow pain at this time as well.  He denies shortness of breath, chest pain, abdominal pain, nausea, vomiting, urinary symptoms.  Past medical history significant for bilateral hip arthritis, hypertension, congestive heart failure with left ventricular diastolic dysfunction, leg edema  HPI     Home Medications Prior to Admission medications   Medication Sig Start Date End Date Taking? Authorizing Provider  acetaminophen (TYLENOL) 500 MG tablet Take 500 mg by mouth 3 (three) times daily.    [provider]  atenolol (TENORMIN) 50 MG tablet Take 1 tablet (50 mg total) by mouth 2 (two) times daily. 10/01/21   Ngetich, Dinah C, NP  diclofenac (VOLTAREN) 75 MG EC tablet Take 1 tablet (75 mg total) by mouth 3 (three) times daily. 04/22/22   Ngetich, Dinah C, NP  hydrochlorothiazide (HYDRODIURIL) 25 MG tablet Take 1 tablet (25 mg total) by mouth daily. 10/01/21   Ngetich, Dinah C, NP  Multiple Vitamins-Minerals (MULTIVITAMIN) tablet Take 1 tablet by mouth daily. 04/28/22   Ngetich, Dinah C, NP  spironolactone (ALDACTONE) 25 MG tablet Take 1 tablet (25 mg total) by mouth 2  (two) times daily. 10/01/21   Ngetich, Dinah C, NP      Allergies    Sulfa antibiotics    Review of Systems   Review of Systems  Respiratory:  Negative for shortness of breath.   Cardiovascular:  Positive for leg swelling. Negative for chest pain.  Gastrointestinal:  Negative for abdominal pain, nausea and vomiting.  Musculoskeletal:  Positive for arthralgias.    Physical Exam Updated Vital Signs BP 134/66   Pulse 73   Temp 97.6 F (36.4 C) (Oral)   Resp 17   Ht '6\' 2"'$  (1.88 m)   Wt (!) 151.5 kg   SpO2 100%   BMI 42.88 kg/m  Physical Exam Vitals and nursing note reviewed.  Constitutional:      General: He is not in acute distress.    Appearance: He is well-developed. He is obese.  HENT:     Head: Normocephalic and atraumatic.     Mouth/Throat:     Mouth: Mucous membranes are moist.  Eyes:     Conjunctiva/sclera: Conjunctivae normal.  Cardiovascular:     Rate and Rhythm: Normal rate and regular rhythm.     Heart sounds: No murmur heard. Pulmonary:     Effort: Pulmonary effort is normal. No respiratory distress.     Breath sounds: Normal breath sounds.  Abdominal:     Palpations: Abdomen is soft.     Tenderness: There is no abdominal tenderness.  Musculoskeletal:  General: Tenderness (Right elbow) present. No deformity.     Cervical back: Neck supple.     Right lower leg: Edema present.     Left lower leg: Edema present.  Skin:    General: Skin is warm and dry.     Capillary Refill: Capillary refill takes less than 2 seconds.  Neurological:     Mental Status: He is alert.  Psychiatric:        Mood and Affect: Mood normal.     ED Results / Procedures / Treatments   Labs (all labs ordered are listed, but only abnormal results are displayed) Labs Reviewed  CBC WITH DIFFERENTIAL/PLATELET - Abnormal; Notable for the following components:      Result Value   Hemoglobin 12.8 (*)    MCH 24.4 (*)    All other components within normal limits   COMPREHENSIVE METABOLIC PANEL - Abnormal; Notable for the following components:   Sodium 133 (*)    Glucose, Bld 114 (*)    BUN 21 (*)    Calcium 8.0 (*)    Albumin 3.2 (*)    AST 42 (*)    All other components within normal limits  BRAIN NATRIURETIC PEPTIDE  URINALYSIS, ROUTINE W REFLEX MICROSCOPIC    EKG None  Radiology DG Hips Bilat W or Wo Pelvis 2 Views  Result Date: 06/13/2022 CLINICAL DATA:  Hip pain and elbow pain. Fluid overload. Bilateral leg swelling and hip pain. Patient fell on the RIGHT side on 06/09/2022, striking the LATERAL elbow. EXAM: DG HIP (WITH OR WITHOUT PELVIS) 2V BILAT COMPARISON:  10/08/2021 in 12/06/2018 FINDINGS: There are advanced degenerative changes of both hips, significantly progressed since prior studies. There is no acute fracture or subluxation. Soft tissues are unremarkable. IMPRESSION: Advanced degenerative changes of both hips, significantly progressed since prior studies. Electronically Signed   By: Nolon Nations M.D.   On: 06/13/2022 09:14   DG Elbow Complete Right  Result Date: 06/13/2022 CLINICAL DATA:  Hip pain and elbow pain. Fluid overload. Bilateral leg swelling and hip pain. Patient fell on the RIGHT side on 06/09/2022, striking the LATERAL elbow. EXAM: RIGHT ELBOW - COMPLETE 3+ VIEW COMPARISON:  None Available. FINDINGS: There is no evidence of fracture, dislocation, or joint effusion. There are mild degenerative changes at the elbow. Soft tissues are unremarkable. Study quality is degraded by nonstandard positioning secondary to limited mobility. IMPRESSION: 1. No acute fracture or dislocation. 2. Mild degenerative changes at the elbow. Electronically Signed   By: Nolon Nations M.D.   On: 06/13/2022 09:10   DG Chest 2 View  Result Date: 06/13/2022 CLINICAL DATA:  Hip pain and elbow pain. Fluid overload. Bilateral leg swelling and hip pain. Patient fell on the RIGHT side on 06/09/2022, striking the LATERAL elbow. EXAM: CHEST - 2 VIEW  COMPARISON:  04/25/2020 FINDINGS: The heart size and mediastinal contours are within normal limits. Both lungs are clear. The visualized skeletal structures are unremarkable. Study quality is degraded by patient body habitus. IMPRESSION: No active cardiopulmonary disease. Electronically Signed   By: Nolon Nations M.D.   On: 06/13/2022 09:09    Procedures Procedures    Medications Ordered in ED Medications  ketorolac (TORADOL) 15 MG/ML injection 15 mg (15 mg Intravenous Given 06/13/22 0856)  furosemide (LASIX) injection 40 mg (40 mg Intravenous Given 06/13/22 0909)    ED Course/ Medical Decision Making/ A&P  Medical Decision Making Amount and/or Complexity of Data Reviewed Labs: ordered. Radiology: ordered.  Risk Prescription drug management.   This patient presents to the ED for concern of bilateral hip pain, right elbow pain, this involves an extensive number of treatment options, and is a complaint that carries with it a high risk of complications and morbidity.  The differential diagnosis includes fracture, dislocation, soft tissue injury, arthritic changes, and others. The patient also complains of fluid retention.  Differential includes lymphedema, fluid retention, heart failure exacerbations, and others   Co morbidities that complicate the patient evaluation  Congestive heart failure, leg edema, hypertension, bilateral hip osteoarthritis   Additional history obtained:  Additional history obtained from EMS External records from outside source obtained and reviewed including notes from orthopedic surgery discussing necessary weight loss prior to further intervention   Lab Tests:  I Ordered, and personally interpreted labs.  The pertinent results include: BNP 36.1, unremarkable CBC, grossly unremarkable CMP,   Imaging Studies ordered:  I ordered imaging studies including chest x-ray, bilateral x-rays of the hips, right elbow x-ray I  independently visualized and interpreted imaging which showed severe osteoarthritis in bilateral hips,Mild arthritis in the right elbow with no fracture or dislocation,No active disease or pulmonary edema noted on chest x-ray I agree with the radiologist interpretation   Problem List / ED Course / Critical interventions / Medication management   I ordered medication including Toradol for inflammation, Lasix for diuresis Reevaluation of the patient after these medicines showed that the patient improved I have reviewed the patients home medicines and have made adjustments as needed   Social Determinants of Health:  Patient is homeless, has Medicaid for primary insurance   Test / Admission - Considered:  The patient's hip pain seems to be related to his underlying osteoarthritis.  It appears that orthopedics may be considering surgery in the future but the patient needs to lose weight to be eligible for elective surgery.  The patient states he is not eating much due to being homeless and trying to lose weight but is still having difficulty with weight loss.  No signs of heart failure exacerbation, normal BNP.  He does have signs of fluid retention, especially in the bilateral feet.  Plan to discharge patient home at this time with recommendations for follow-up with primary care for further evaluation of his fluid retention and for discussion of weight loss options.  Patient will follow-up with orthopedics for further evaluation of his bilateral hip and right elbow pain.  Unfortunately I feel that his issues are compounded with his homelessness as he has to sleep on concrete.  Plan to provide resources for local shelters as well.        Final Clinical Impression(s) / ED Diagnoses Final diagnoses:  Leg edema  Hip arthritis    Rx / DC Orders ED Discharge Orders     None         Ronny Bacon 06/13/22 1142    Wyvonnia Dusky, MD 06/13/22 1315

## 2022-06-13 NOTE — ED Triage Notes (Signed)
Patient BIB EMS, c/o bilateral leg swelling/pain, right arm pain Pain rated 8/10 Swelling x 4 days

## 2022-06-13 NOTE — ED Notes (Signed)
Pt states that he has no way of getting home and [place] when I offered him a bus pass. He asked for a cab. Sent a message to social worker to see what options we have.

## 2022-06-13 NOTE — ED Notes (Signed)
Patient states he was homeless after losing job, he was attending a program but the program ended one week ago. Now he is sleeping at [facility] every night on concrete floor and this is not good for his hips.

## 2022-06-13 NOTE — Discharge Instructions (Signed)
You were evaluated today for swelling in the extremities. You were given lasix here in the emergency department to help with diuresis. Please follow up with your primary care physician for further evaluation and management of your fluid retention. I also suggest discussing weight loss with your PCP.  Please follow up as needed with your orthopedic provider. Please continue taking your Voltaren (diclofenac) tablets as prescribed for inflammation.

## 2022-06-13 NOTE — Progress Notes (Signed)
Contacted via secure chat by RN and paramedic to assist with transportation. Pt will d/c via [employer] [phone]). Driver will call [phone] when they arrive. ETA is 1.5 hours. Pt will be transported to the [facility]. RN's given the above informatio via secure chat.   Pt will d/c in hospital wheelchair and the wheelchair will be returned by [facility] after drop off. No further TOC needs. TOC signing off.

## 2022-06-15 ENCOUNTER — Telehealth: Payer: Self-pay

## 2022-06-16 ENCOUNTER — Telehealth: Payer: Self-pay | Admitting: Family

## 2022-06-16 NOTE — Transitions of Care (Post Inpatient/ED Visit) (Signed)
   06/16/2022  Name: Jerry Jennings MRN: [id number] DOB: November 27, 1965  Today's TOC FU Call Status: Today's TOC FU Call Status:: Successful TOC FU Call Competed Unsuccessful Call (1st Attempt) Date: 06/15/22  Attempted to reach the patient regarding the most recent Inpatient/ED visit.  Follow Up Plan: No further outreach attempts will be made at this time. We have been unable to contact the patient.  Patient refused to go over medication, allergies, etc. No appointment was made.   Signature: Sharlon Pfohl.D/RMA

## 2022-06-16 NOTE — Telephone Encounter (Signed)
Patient called in stated that he spoke to someone but was not sure of the persons name he spoke with. I informed JD ([place]) that he was calling back she asked me to offer him an appointment with provider Dinah but patient refused and started complaining that he is in pain and sleeping out on the street on the concrete being that he is currently homeless. Also stated that getting him a Education officer, museum will not help him if they can't give him a place to live right away so he refused all suggestions. Per patient he is in lots of pain and all that Dinah gave him were generic pills that could do nothing for him and was not enough and that no one took time out to give him what he requested like a Hover-round he states that he need to have surgery but due to his current weight he have been refused the back surgery that will help ease his pain. He stated that he will be going to see a new Dr on Monday 06/22/2022 because what he needed [facility] was not doing for him and started yelling about not getting the proper name brand medication that he requested. Please advise

## 2022-06-16 NOTE — Transitions of Care (Post Inpatient/ED Visit) (Signed)
   06/16/2022  Name: Jerry Jennings MRN: [id number] DOB: Aug 21, 1965  Today's TOC FU Call Status: Today's TOC FU Call Status:: Successful TOC FU Call Competed Unsuccessful Call (1st Attempt) Date: 06/15/22 [facility] FU Call Complete Date: 06/16/22  Transition Care Management Follow-up Telephone Call Date of Discharge: 06/13/22 Discharge Facility: [facility]) Type of Discharge: Emergency Department Reason for ED Visit: Other: (Leg Edema) How have you been since you were released from the hospital?: Worse Any questions or concerns?: No  Items Reviewed: Did you receive and understand the discharge instructions provided?: Yes Medications obtained and verified?: Yes (Medications Reviewed) Any new allergies since your discharge?: No Dietary orders reviewed?: NA Do you have support at home?: No Name of Support/Comfort Primary Source: patient states his family is dead.  Home Care and Equipment/Supplies: Were Home Health Services Ordered?: No Any new equipment or medical supplies ordered?: No  Functional Questionnaire: Do you need assistance with bathing/showering or dressing?: Yes Do you need assistance with meal preparation?: Yes Do you need assistance with eating?: No Do you have difficulty maintaining continence: No Do you need assistance with getting out of bed/getting out of a chair/moving?:  (Patient was rude and wouldnt complete questions.) Do you have difficulty managing or taking your medications?:  (Patient was rude and wouldnt complete questions.)  Folllow up appointments reviewed: PCP Follow-up appointment confirmed?: No (Patient was rude and wouldnt complete questions.) [facility] Follow-up appointment confirmed?:  (Patient was rude and wouldnt complete questions.) Do you need transportation to your follow-up appointment?:  (Patient was rude and wouldnt complete questions.) Do you understand care options if your condition(s) worsen?:  (Patient was rude and wouldnt  complete questions.)    SIGNATURE: Meilyn Heindl.D/RMA

## 2022-06-16 NOTE — Telephone Encounter (Signed)
Patient refused to go over questions and medications. No appointment was scheduled due to patient.

## 2022-06-18 NOTE — Telephone Encounter (Signed)
Unable to get in touch with patient.

## 2022-07-07 ENCOUNTER — Other Ambulatory Visit: Payer: Self-pay

## 2022-07-07 ENCOUNTER — Encounter (HOSPITAL_COMMUNITY): Payer: Self-pay | Admitting: *Deleted

## 2022-07-07 ENCOUNTER — Emergency Department (HOSPITAL_COMMUNITY)
Admission: EM | Admit: 2022-07-07 | Discharge: 2022-07-07 | Disposition: A | Discharge status: Home / Self Care | Payer: Medicaid Other | Attending: Emergency Medicine | Admitting: Emergency Medicine

## 2022-07-07 ENCOUNTER — Emergency Department (HOSPITAL_COMMUNITY): Payer: Medicaid Other

## 2022-07-07 DIAGNOSIS — M25552 Pain in left hip: Secondary | ICD-10-CM | POA: Diagnosis not present

## 2022-07-07 DIAGNOSIS — Z79899 Other long term (current) drug therapy: Secondary | ICD-10-CM | POA: Diagnosis not present

## 2022-07-07 DIAGNOSIS — L539 Erythematous condition, unspecified: Secondary | ICD-10-CM | POA: Insufficient documentation

## 2022-07-07 DIAGNOSIS — M25551 Pain in right hip: Secondary | ICD-10-CM | POA: Insufficient documentation

## 2022-07-07 DIAGNOSIS — I509 Heart failure, unspecified: Secondary | ICD-10-CM | POA: Diagnosis not present

## 2022-07-07 DIAGNOSIS — I110 Hypertensive heart disease with heart failure: Secondary | ICD-10-CM | POA: Insufficient documentation

## 2022-07-07 DIAGNOSIS — R600 Localized edema: Secondary | ICD-10-CM | POA: Insufficient documentation

## 2022-07-07 DIAGNOSIS — M7989 Other specified soft tissue disorders: Secondary | ICD-10-CM | POA: Diagnosis present

## 2022-07-07 HISTORY — DX: Heart failure, unspecified: I50.9

## 2022-07-07 LAB — COMPREHENSIVE METABOLIC PANEL
ALT: 35 U/L (ref 0–44)
AST: 41 U/L (ref 15–41)
Albumin: 3.4 g/dL — ABNORMAL LOW (ref 3.5–5.0)
Alkaline Phosphatase: 79 U/L (ref 38–126)
Anion gap: 7 (ref 5–15)
BUN: 21 mg/dL — ABNORMAL HIGH (ref 6–20)
CO2: 26 mmol/L (ref 22–32)
Calcium: 8.3 mg/dL — ABNORMAL LOW (ref 8.9–10.3)
Chloride: 101 mmol/L (ref 98–111)
Creatinine, Ser: 0.82 mg/dL (ref 0.61–1.24)
GFR, Estimated: >60 mL/min (ref >60)
Glucose, Bld: 120 mg/dL — ABNORMAL HIGH (ref 70–99)
Potassium: 4 mmol/L (ref 3.5–5.1)
Sodium: 134 mmol/L — ABNORMAL LOW (ref 135–145)
Total Bilirubin: 0.5 mg/dL (ref 0.3–1.2)
Total Protein: 7.1 g/dL (ref 6.5–8.1)

## 2022-07-07 LAB — URINALYSIS, ROUTINE W REFLEX MICROSCOPIC
Bilirubin Urine: NEGATIVE
Glucose, UA: NEGATIVE mg/dL
Hgb urine dipstick: NEGATIVE
Ketones, ur: NEGATIVE mg/dL
Leukocytes,Ua: NEGATIVE
Nitrite: NEGATIVE
Protein, ur: NEGATIVE mg/dL
Specific Gravity, Urine: 1.018 (ref 1.005–1.030)
pH: 6 (ref 5.0–8.0)

## 2022-07-07 LAB — CBC WITH DIFFERENTIAL/PLATELET
Abs Immature Granulocytes: 0.04 10*3/uL (ref 0.00–0.07)
Basophils Absolute: 0 10*3/uL (ref 0.0–0.1)
Basophils Relative: 0 %
Eosinophils Absolute: 0.2 10*3/uL (ref 0.0–0.5)
Eosinophils Relative: 3 %
HCT: 41.1 % (ref 39.0–52.0)
Hemoglobin: 12.2 g/dL — ABNORMAL LOW (ref 13.0–17.0)
Immature Granulocytes: 1 %
Lymphocytes Relative: 20 %
Lymphs Abs: 1.4 10*3/uL (ref 0.7–4.0)
MCH: 24.4 pg — ABNORMAL LOW (ref 26.0–34.0)
MCHC: 29.7 g/dL — ABNORMAL LOW (ref 30.0–36.0)
MCV: 82.2 fL (ref 80.0–100.0)
Monocytes Absolute: 0.6 10*3/uL (ref 0.1–1.0)
Monocytes Relative: 9 %
Neutro Abs: 4.6 10*3/uL (ref 1.7–7.7)
Neutrophils Relative %: 67 %
Platelets: 193 10*3/uL (ref 150–400)
RBC: 5 MIL/uL (ref 4.22–5.81)
RDW: 13.4 % (ref 11.5–15.5)
WBC: 6.8 10*3/uL (ref 4.0–10.5)
nRBC: 0 % (ref 0.0–0.2)

## 2022-07-07 LAB — BRAIN NATRIURETIC PEPTIDE: B Natriuretic Peptide: 33.9 pg/mL (ref 0.0–100.0)

## 2022-07-07 MED ORDER — FUROSEMIDE 40 MG PO TABS: 40.0000 mg | ORAL_TABLET | Freq: Two times a day (BID) | ORAL | 0 refills | Status: AC

## 2022-07-07 MED ORDER — KETOROLAC TROMETHAMINE 60 MG/2ML IM SOLN
30.0000 mg | Freq: Once | INTRAMUSCULAR | Status: AC
  Administered 2022-07-07: 30 mg via INTRAMUSCULAR
  Filled 2022-07-07: qty 2

## 2022-07-07 NOTE — Discharge Instructions (Addendum)
Please make an appointment with your primary care provider regarding recent ER visit and symptoms.  You will be given compression stockings here for your legs.  Please take your Lasix as prescribed which is 2 times daily and follow-up with your primary care provider.  You may alternate every 6 hours needed for pain between Tylenol and ibuprofen.  If symptoms worsen please return to ER.

## 2022-07-07 NOTE — ED Provider Notes (Signed)
[facility] Provider Note   CSN: [id number] Arrival date & time: 07/07/22  [id number]     History  Chief Complaint  Patient presents with   Extremity Pain    Jerry Jennings is a 57 y.o. male history of arthritis, CHF, hypertension presented with lower leg swelling that has been present for over a month.  Patient was seen on 06/13/2022 for the same concern and had reassuring workup.  Patient is prescribed Lasix twice daily however he reports only taking it once daily due to feeling his legs looking on swollen.  However Noted after he was kicked out of the hotel and began sleeping on concrete again that he stopped taking his Lasix because he did not have a bed but still has the medication.  Patient stated he only began taking his Lasix in the past 2 weeks after not taking it for an extended period of time and has only been taking 1 Lasix pill when he is prescribed to take it 2 times daily.  Patient does not wear compression stocking.  Patient states he still can feel in his feet and has good sensation and can move his toes however he continues to have ongoing chronic hip pain from his arthritis.  Patient also notes the swelling in his legs have gone up to his knees as well.    Patient denied any chest pain, shortness of breath, abdominal pain, nausea/vomiting, overlying skin color changes, changes sensation/motor skills, fever/chills, dysuria  Home Medications Prior to Admission medications   Medication Sig Start Date End Date Taking? Authorizing Provider  furosemide (LASIX) 40 MG tablet Take 1 tablet (40 mg total) by mouth 2 (two) times daily. 07/07/22 08/06/22 Yes Nobel Brar, Florene Route, PA-C  acetaminophen (TYLENOL) 500 MG tablet Take 500 mg by mouth 3 (three) times daily.    [provider]  atenolol (TENORMIN) 50 MG tablet Take 1 tablet (50 mg total) by mouth 2 (two) times daily. 10/01/21   Ngetich, Dinah C, NP  diclofenac (VOLTAREN) 75 MG EC tablet Take  1 tablet (75 mg total) by mouth 3 (three) times daily. 04/22/22   Ngetich, Dinah C, NP  hydrochlorothiazide (HYDRODIURIL) 25 MG tablet Take 1 tablet (25 mg total) by mouth daily. 10/01/21   Ngetich, Dinah C, NP  Multiple Vitamins-Minerals (MULTIVITAMIN) tablet Take 1 tablet by mouth daily. 04/28/22   Ngetich, Dinah C, NP  spironolactone (ALDACTONE) 25 MG tablet Take 1 tablet (25 mg total) by mouth 2 (two) times daily. 10/01/21   Ngetich, Nelda Bucks, NP      Allergies    Sulfa antibiotics    Review of Systems   Review of Systems Leg edema See HPI Physical Exam Updated Vital Signs BP (!) 175/78 (BP Location: Left Arm)   Pulse 95   Temp 97.7 F (36.5 C) (Oral)   Resp 16   Ht 6\' 2"  (1.88 m)   Wt (!) 159.2 kg   SpO2 95%   BMI 45.07 kg/m  Physical Exam Constitutional:      General: He is not in acute distress. Cardiovascular:     Rate and Rhythm: Normal rate and regular rhythm.     Pulses: Normal pulses.     Comments: 2+ bilateral dorsalis pedis pulses with regular rate Pulmonary:     Effort: Pulmonary effort is normal. No respiratory distress.     Breath sounds: Rhonchi present.  Abdominal:     Tenderness: There is no abdominal tenderness. There is no guarding  or rebound.  Musculoskeletal:        General: Tenderness (Bilateral hip tenderness) present.     Right lower leg: Edema present.     Left lower leg: Edema present.     Comments: Bilateral 2+ pitting edema all the way up to the knees Able to range toes and ankles  Skin:    General: Skin is warm and dry.     Capillary Refill: Capillary refill takes less than 2 seconds.     Comments: Left foot: Signs of erythema at the ankle  Neurological:     General: No focal deficit present.     Mental Status: He is alert and oriented to person, place, and time.     Comments: Sensation intact in all 4 limbs  Psychiatric:        Mood and Affect: Mood normal.     ED Results / Procedures / Treatments   Labs (all labs ordered are  listed, but only abnormal results are displayed) Labs Reviewed  COMPREHENSIVE METABOLIC PANEL - Abnormal; Notable for the following components:      Result Value   Sodium 134 (*)    Glucose, Bld 120 (*)    BUN 21 (*)    Calcium 8.3 (*)    Albumin 3.4 (*)    All other components within normal limits  CBC WITH DIFFERENTIAL/PLATELET - Abnormal; Notable for the following components:   Hemoglobin 12.2 (*)    MCH 24.4 (*)    MCHC 29.7 (*)    All other components within normal limits  BRAIN NATRIURETIC PEPTIDE  URINALYSIS, ROUTINE W REFLEX MICROSCOPIC    EKG None  Radiology DG Chest Port 1 View  Result Date: 07/07/2022 CLINICAL DATA:  Pedal edema. EXAM: PORTABLE CHEST 1 VIEW COMPARISON:  06/13/2022. FINDINGS: Clear lungs. Normal heart size and mediastinal contours. No pleural effusion or pneumothorax. Visualized bones and upper abdomen are unremarkable. IMPRESSION: No evidence of acute cardiopulmonary disease. Electronically Signed   By: Emmit Alexanders M.D.   On: 07/07/2022 08:57    Procedures Procedures    Medications Ordered in ED Medications  ketorolac (TORADOL) injection 30 mg (has no administration in time range)    ED Course/ Medical Decision Making/ A&P                             Medical Decision Making Amount and/or Complexity of Data Reviewed Labs: ordered. Radiology: ordered.   Jerry Jennings 57 y.o. presented today for leg edema. Working DDx that I considered at this time includes, but not limited to, CHF exacerbation, cirrhosis, electrolyte abnormalities, osteoarthritis, DVT, venous stasis, cellulitis, AAA, aortic dissection, kidney failure.  R/o DDx: VTE, cellulitis, electrolyte abnormalities, cirrhosis, CHF exacerbation, AAA, aortic dissection, kidney failure: These are considered less likely due to history of present illness and physical exam findings  Review of prior external notes: 06/13/2022 ED  Unique Tests and My Interpretation:  BNP: Unremarkable CMP:  Unremarkable UA: Unremarkable CBC: Unremarkable Chest x-ray: No acute cardiopulmonary changes  Discussion with Independent Historian: None  Discussion of Management of Tests: None  Risk: Medium: prescription drug management  Risk Stratification Score: None  Staffed with Ray, MD  Plan: Patient presented for bilateral leg edema. On exam patient was in no acute distress and stable vitals.  Patient is exam was remarkable for 2+ bilateral edema although to his knees however had good sensation and motor skill.  Pulses were faint due to swelling but palpable.  Rhonchi was noted in patient's left lung.  Labs and imaging will be ordered and patient will be reevaluated.  Due to patient's chronic osteoarthritis in his hips and no recent trauma or step-off/crepitus palpated pelvic x-ray was not ordered.  Patient had some erythema in his left ankle however I suspect this is most likely venous stasis as patient has significant swelling and does not wear compression socks.  Patient also denied any systemic symptoms including fevers/chills, nausea/vomiting, pain when palpated.  Patient stable this time.  Patient's labs are unremarkable for any life-threatening diagnoses at this time.  Patient was given Toradol for his osteoarthritic pain and encouraged to follow-up with his primary care provider.  Patient states he only takes 1 Lasix a day and 2 hydrochlorothiazide pills when his Lasix as prescribed 2 times daily.  I spoke to patient about how he needs to be taking his Lasix as prescribed and patient verbalized his frustration plan to take 3 pills and does not believe he needs to take 4 pills daily and does not know why his legs keep swelling.  Patient will also be given a compression stocking here as he does not wear 1 and I spoke with the patient about how this will help and his fluid swelling however patient was still upset that his legs continue to swell.  I spoke to the patient extensively about how he needs  to be taking his medication as prescribed and wear the compression stockings and keep his feet elevated when he sleeps however patient verbalized multiple times that he is going to go to another ER as he believes there is something else wrong.  Patient stated his Lasix is old and that he did not use it for some time until about 2 weeks ago and he has only been taking 1 pill and so I offered to refill his Lasix and patient stated he would be happy if I refilled his Lasix.  Patient was given return precautions. Patient stable for discharge at this time.  Patient verbalized understanding of plan.        Final Clinical Impression(s) / ED Diagnoses Final diagnoses:  Bilateral leg edema    Rx / DC Orders ED Discharge Orders          Ordered    Compression stockings        07/07/22 1002    furosemide (LASIX) 40 MG tablet  2 times daily        07/07/22 1021              [facility] 07/07/22 1025    Pattricia Boss, MD 07/09/22 1351

## 2022-07-07 NOTE — ED Notes (Signed)
Pt requested to speak w/social work "because I can't keep lying on the concrete".

## 2022-07-07 NOTE — ED Triage Notes (Signed)
BIB EMS from [facility] due to extremity pain, edema in ankles and legs, pt is sleeping on concrete at [facility]. These issues are chronic. 148/p-[id number]-CBG 117

## 2022-07-08 ENCOUNTER — Telehealth: Payer: Self-pay

## 2022-07-08 NOTE — Transitions of Care (Post Inpatient/ED Visit) (Signed)
   07/08/2022  Name: Jerry Jennings MRN: [id number] DOB: 1965/08/28  Today's TOC FU Call Status: Today's TOC FU Call Status:: Unsuccessul Call (1st Attempt) Unsuccessful Call (1st Attempt) Date: 07/08/22 Patient phone kept ringing unable to leave voicemail.  Attempted to reach the patient regarding the most recent Inpatient/ED visit.  Follow Up Plan: Additional outreach attempts will be made to reach the patient to complete the Transitions of Care (Post Inpatient/ED visit) call.   Signature: Kynlei Piontek.D/RMA

## 2022-07-09 NOTE — Transitions of Care (Post Inpatient/ED Visit) (Signed)
   07/09/2022  Name: Jerry Jennings MRN: [id number] DOB: 12-Jun-1965  Today's TOC FU Call Status: Today's TOC FU Call Status:: Unsuccessful Call (2nd Attempt) Unsuccessful Call (1st Attempt) Date: 07/08/22 Unsuccessful Call (2nd Attempt) Date: 07/09/22  Attempted to reach the patient regarding the most recent Inpatient/ED visit.  Follow Up Plan: Additional outreach attempts will be made to reach the patient to complete the Transitions of Care (Post Inpatient/ED visit) call.   Signature: Jelena Malicoat.D/RMA

## 2022-07-10 NOTE — Transitions of Care (Post Inpatient/ED Visit) (Signed)
   07/10/2022  Name: Jerry Jennings MRN: [id number] DOB: February 19, 1966  Today's TOC FU Call Status: Today's TOC FU Call Status:: Successful TOC FU Call Competed Unsuccessful Call (1st Attempt) Date: 07/08/22 Unsuccessful Call (2nd Attempt) Date: 07/09/22 [facility] FU Call Complete Date: 07/10/22  Transition Care Management Follow-up Telephone Call Date of Discharge: 07/07/22 Discharge Facility: [facility]) Type of Discharge: Emergency Department Reason for ED Visit: Other: (Bilateral Leg Swelling.)  Items Reviewed:    Home Care and Equipment/Supplies:    Functional Questionnaire:    Follow up appointments reviewed:  Patient changed primary care and is no longer with [facility] office.    SIGNATURE: Malai Lady.D/RMA

## 2022-07-22 ENCOUNTER — Ambulatory Visit: Payer: Medicaid Other | Attending: Physical Therapy | Admitting: Physical Therapy

## 2022-08-13 ENCOUNTER — Other Ambulatory Visit: Payer: Self-pay

## 2022-08-13 ENCOUNTER — Emergency Department (HOSPITAL_COMMUNITY): Payer: Medicaid Other

## 2022-08-13 ENCOUNTER — Emergency Department (HOSPITAL_COMMUNITY)
Admission: EM | Admit: 2022-08-13 | Discharge: 2022-08-13 | Disposition: A | Discharge status: Home / Self Care | Payer: Medicaid Other | Attending: Emergency Medicine | Admitting: Emergency Medicine

## 2022-08-13 DIAGNOSIS — M25552 Pain in left hip: Secondary | ICD-10-CM | POA: Insufficient documentation

## 2022-08-13 DIAGNOSIS — M25551 Pain in right hip: Secondary | ICD-10-CM | POA: Insufficient documentation

## 2022-08-13 DIAGNOSIS — I110 Hypertensive heart disease with heart failure: Secondary | ICD-10-CM | POA: Insufficient documentation

## 2022-08-13 DIAGNOSIS — I509 Heart failure, unspecified: Secondary | ICD-10-CM | POA: Diagnosis not present

## 2022-08-13 LAB — BASIC METABOLIC PANEL
Anion gap: 14 (ref 5–15)
BUN: 14 mg/dL (ref 6–20)
CO2: 27 mmol/L (ref 22–32)
Calcium: 8.9 mg/dL (ref 8.9–10.3)
Chloride: 96 mmol/L — ABNORMAL LOW (ref 98–111)
Creatinine, Ser: 0.92 mg/dL (ref 0.61–1.24)
GFR, Estimated: >60 mL/min (ref >60)
Glucose, Bld: 113 mg/dL — ABNORMAL HIGH (ref 70–99)
Potassium: 4.2 mmol/L (ref 3.5–5.1)
Sodium: 137 mmol/L (ref 135–145)

## 2022-08-13 LAB — CBC WITH DIFFERENTIAL/PLATELET
Abs Immature Granulocytes: 0.03 10*3/uL (ref 0.00–0.07)
Basophils Absolute: 0 10*3/uL (ref 0.0–0.1)
Basophils Relative: 0 %
Eosinophils Absolute: 0.3 10*3/uL (ref 0.0–0.5)
Eosinophils Relative: 4 %
HCT: 47.7 % (ref 39.0–52.0)
Hemoglobin: 14.1 g/dL (ref 13.0–17.0)
Immature Granulocytes: 0 %
Lymphocytes Relative: 16 %
Lymphs Abs: 1.1 10*3/uL (ref 0.7–4.0)
MCH: 24.2 pg — ABNORMAL LOW (ref 26.0–34.0)
MCHC: 29.6 g/dL — ABNORMAL LOW (ref 30.0–36.0)
MCV: 81.8 fL (ref 80.0–100.0)
Monocytes Absolute: 0.7 10*3/uL (ref 0.1–1.0)
Monocytes Relative: 9 %
Neutro Abs: 5.1 10*3/uL (ref 1.7–7.7)
Neutrophils Relative %: 71 %
Platelets: 241 10*3/uL (ref 150–400)
RBC: 5.83 MIL/uL — ABNORMAL HIGH (ref 4.22–5.81)
RDW: 13.2 % (ref 11.5–15.5)
WBC: 7.2 10*3/uL (ref 4.0–10.5)
nRBC: 0 % (ref 0.0–0.2)

## 2022-08-13 MED ORDER — FUROSEMIDE 10 MG/ML IJ SOLN
40.0000 mg | Freq: Once | INTRAMUSCULAR | Status: AC
  Administered 2022-08-13: 40 mg via INTRAVENOUS
  Filled 2022-08-13: qty 4

## 2022-08-13 MED ORDER — KETOROLAC TROMETHAMINE 15 MG/ML IJ SOLN
15.0000 mg | Freq: Once | INTRAMUSCULAR | Status: AC
  Administered 2022-08-13: 15 mg via INTRAVENOUS
  Filled 2022-08-13: qty 1

## 2022-08-13 NOTE — Discharge Instructions (Signed)
You were seen today due to bilateral hip pain.  This is due to your underlying arthritis.  Please follow-up with orthopedics for further evaluation and management.  A refill of your Lasix was sent at your request.  If you develop any life-threatening symptoms please return to the emergency department.

## 2022-08-13 NOTE — ED Notes (Signed)
Pt ambulated in the hall. Pt walks with both cane and walker. Pt able to ambulate independently very slowly.

## 2022-08-13 NOTE — Progress Notes (Signed)
TOC consulted for shelter resources. Resources provided and attached to AVS.

## 2022-08-13 NOTE — ED Provider Notes (Signed)
Pt able to ambulate with walker and cane.  Pt moves slowly.    Elson Areas, [place] 08/13/22 1545    Glyn Ade, MD 08/14/22 [phone]

## 2022-08-13 NOTE — ED Notes (Signed)
Pt is a&ox4, warm and dry to touch. Pt complains of pain to bilateral hips/knees/legs/feet. Some slight edema noted to ankles and feet. Pt denies any cp/shob. Limited mobility due to pain. Both side rails up, covered with a blanket.

## 2022-08-13 NOTE — ED Provider Notes (Signed)
[place] EMERGENCY DEPARTMENT AT [facility] Provider Note   CSN: [id number] Arrival date & time: 08/13/22  1517     History  Chief Complaint  Patient presents with   Leg Pain    Pt has hx of chronic bilateral hip and knee pain due to arthritis. Pt has limited mobility due to pain. Denies any cp/shob.    Jerry Jennings is a 57 y.o. male.  Patient presents to the emergency department complaining of bilateral hip pain.  Patient denies any new traumas or falls.  Patient with history of chronic bilateral hip pain and reportedly needs hip replacements but is currently homeless and unable to get surgery.  He states that he sleeps on concrete which exacerbates his symptoms.  The patient is also currently in police custody.  Jerry Jennings states that the jail is unable to take the patient due to his inability to walk due to pain.  The patient is able to move his extremities but does not move the hips due to the pain.  Patient also has medical history relevant for hypertension, leg edema, CHF.  Patient states he took his Lasix yesterday but was unable to get any further refills of Lasix due to having no insurance and no financial ability to pay.  He denies shortness of breath, chest pain, abdominal pain, nausea, vomiting at this time.  He does endorse chronic bilateral leg swelling along with the bilateral hip pain.  HPI     Home Medications Prior to Admission medications   Medication Sig Start Date End Date Taking? Authorizing Provider  acetaminophen (TYLENOL) 500 MG tablet Take 500 mg by mouth 3 (three) times daily.    [provider]  atenolol (TENORMIN) 50 MG tablet Take 1 tablet (50 mg total) by mouth 2 (two) times daily. 10/01/21   Ngetich, Dinah C, NP  diclofenac (VOLTAREN) 75 MG EC tablet Take 1 tablet (75 mg total) by mouth 3 (three) times daily. 04/22/22   Ngetich, Dinah C, NP  furosemide (LASIX) 40 MG tablet Take 1 tablet (40 mg total) by mouth 2 (two) times daily. 07/07/22  08/06/22  Netta Corrigan, PA-C  hydrochlorothiazide (HYDRODIURIL) 25 MG tablet Take 1 tablet (25 mg total) by mouth daily. 10/01/21   Ngetich, Dinah C, NP  Multiple Vitamins-Minerals (MULTIVITAMIN) tablet Take 1 tablet by mouth daily. 04/28/22   Ngetich, Dinah C, NP  spironolactone (ALDACTONE) 25 MG tablet Take 1 tablet (25 mg total) by mouth 2 (two) times daily. 10/01/21   Ngetich, Donalee Citrin, NP      Allergies    Sulfa antibiotics    Review of Systems   Review of Systems  Physical Exam Updated Vital Signs BP (!) 160/76 (BP Location: Right Arm)   Pulse 88   Temp 99.2 F (37.3 C) (Oral)   Resp 19   Ht 6\' 2"  (1.88 m)   Wt (!) 154.7 kg   SpO2 96%   BMI 43.78 kg/m  Physical Exam Vitals and nursing note reviewed.  Constitutional:      General: He is not in acute distress.    Appearance: He is well-developed.  HENT:     Head: Normocephalic and atraumatic.     Mouth/Throat:     Mouth: Mucous membranes are moist.  Eyes:     Conjunctiva/sclera: Conjunctivae normal.  Cardiovascular:     Rate and Rhythm: Normal rate and regular rhythm.     Heart sounds: No murmur heard. Pulmonary:     Effort: Pulmonary effort is  normal. No respiratory distress.     Breath sounds: Normal breath sounds.  Abdominal:     Palpations: Abdomen is soft.     Tenderness: There is no abdominal tenderness.  Musculoskeletal:     Cervical back: Neck supple.     Right lower leg: Edema present.     Left lower leg: Edema present.     Comments: Patient with groin pain with passive manipulation of bilateral hips.  Lower extremities with mild edema.  Palpable pedal pulses bilaterally.  Skin:    General: Skin is warm and dry.     Capillary Refill: Capillary refill takes less than 2 seconds.  Neurological:     Mental Status: He is alert and oriented to person, place, and time.  Psychiatric:        Mood and Affect: Mood normal.     ED Results / Procedures / Treatments   Labs (all labs ordered are listed, but  only abnormal results are displayed) Labs Reviewed  BASIC METABOLIC PANEL - Abnormal; Notable for the following components:      Result Value   Chloride 96 (*)    Glucose, Bld 113 (*)    All other components within normal limits  CBC WITH DIFFERENTIAL/PLATELET - Abnormal; Notable for the following components:   RBC 5.83 (*)    MCH 24.2 (*)    MCHC 29.6 (*)    All other components within normal limits    EKG None  Radiology DG HIP UNILAT WITH PELVIS 2-3 VIEWS LEFT  Result Date: 08/13/2022 CLINICAL DATA:  [id number] Pain [id number] EXAM: DG HIP (WITH OR WITHOUT PELVIS) 2-3V LEFT COMPARISON:  12/06/2018. FINDINGS: There is severe osteoarthritis of the left hip with bony remodeling and flattening of the femoral head. Bulky osteophyte formation extensive subchondral sclerosis. There is no evidence of acute fracture. There is severe osteoarthritis of the right hip as well. IMPRESSION: No evidence of acute fracture. Severe left hip osteoarthritis with bony remodeling and flattening of the femoral head. Electronically Signed   By: Caprice Renshaw M.D.   On: 08/13/2022 11:23   DG HIP UNILAT WITH PELVIS 2-3 VIEWS RIGHT  Result Date: 08/13/2022 CLINICAL DATA:  Larey Seat 3 days ago, hip pain EXAM: DG HIP (WITH OR WITHOUT PELVIS) 2-3V RIGHT COMPARISON:  10/08/2021 FINDINGS: Frontal and frogleg lateral views of the right hip are obtained. There are no acute displaced fractures. Severe right hip osteoarthritis, with pronounced joint space narrowing, marginal osteophyte formation, and subchondral cyst within the right femoral head. Soft tissues are unremarkable. Visualized portions of the bony pelvis are unremarkable. IMPRESSION: 1. Severe right hip osteoarthritis.  No acute fracture. Electronically Signed   By: Sharlet Salina M.D.   On: 08/13/2022 11:21    Procedures Procedures    Medications Ordered in ED Medications  ketorolac (TORADOL) 15 MG/ML injection 15 mg (15 mg Intravenous Given 08/13/22 1140)  furosemide  (LASIX) injection 40 mg (40 mg Intravenous Given 08/13/22 1140)    ED Course/ Medical Decision Making/ A&P                             Medical Decision Making Amount and/or Complexity of Data Reviewed Labs: ordered. Radiology: ordered.  Risk Prescription drug management.   This patient presents to the ED for concern of hip pain, this involves an extensive number of treatment options, and is a complaint that carries with it a high risk of complications and morbidity.  The differential  diagnosis includes AVN, osteoarthritis, fracture, dislocation, others   Co morbidities that complicate the patient evaluation  History of chronic hip pain   Additional history obtained:  Additional history obtained from law enforcement    Lab Tests:  I Ordered, and personally interpreted labs.  The pertinent results include: Grossly unremarkable BMP, CBC   Imaging Studies ordered:  I ordered imaging studies including films of bilateral hips I independently visualized and interpreted imaging which showed end-stage arthritis of bilateral hips, no fracture, no dislocation I agree with the radiologist interpretation   Problem List / ED Course / Critical interventions / Medication management   I ordered medication including Toradol for pain, Lasix for diuresis Reevaluation of the patient after these medicines showed that the patient improved I have reviewed the patients home medicines and have made adjustments as needed   Social Determinants of Health:  Patient is currently homeless   Test / Admission - Considered:  Patient with no acute findings on workup today.  Patient with known significant osteoarthritis, likely in need of future bilateral total hip arthroplasty.  Patient will need follow-up with orthopedics.  Patient care transferred to Langston Masker, PA-C at shift handoff with plans for ambulation trial.  Patient able to ambulate with walker (patient ambulates with cane or walker at  baseline), plan to discharge         Final Clinical Impression(s) / ED Diagnoses Final diagnoses:  Bilateral hip pain    Rx / DC Orders ED Discharge Orders     None         Pamala Duffel 08/13/22 1607    Virgina Norfolk, DO 08/14/22 [phone]

## 2022-08-13 NOTE — ED Notes (Signed)
Pt handed a piece of paper with CW Amber's phone number.

## 2022-12-29 IMAGING — CR DG CHEST 2V
2 series · 2 of 2 positions shown · non-contrast
Comparison: 05/03/2019

CLINICAL DATA: Shortness of breath, RIGHT-sided chest pain, onset
of symptoms this morning, hypertension

EXAM:
CHEST - 2 VIEW

[w chest lat]
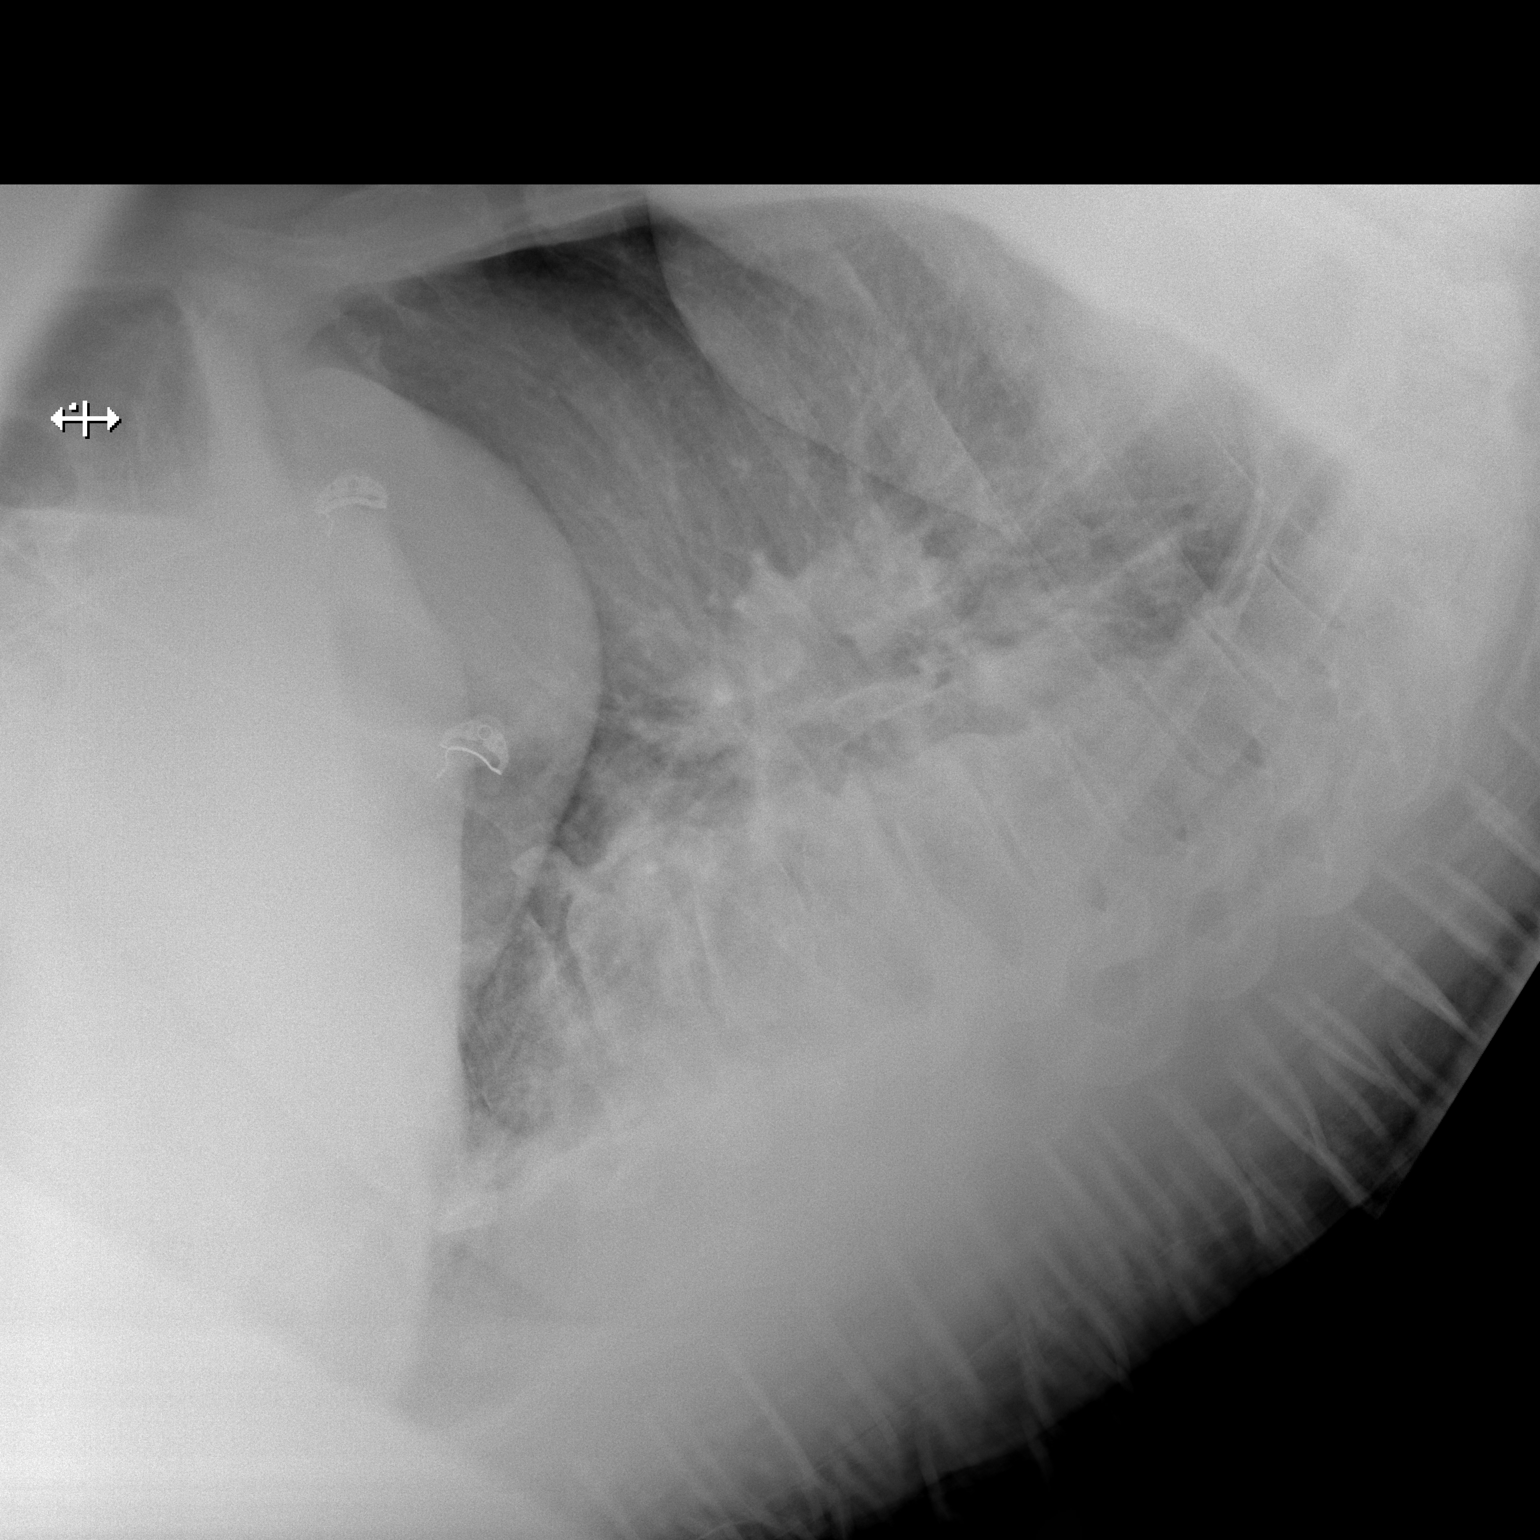

[x chest ap]
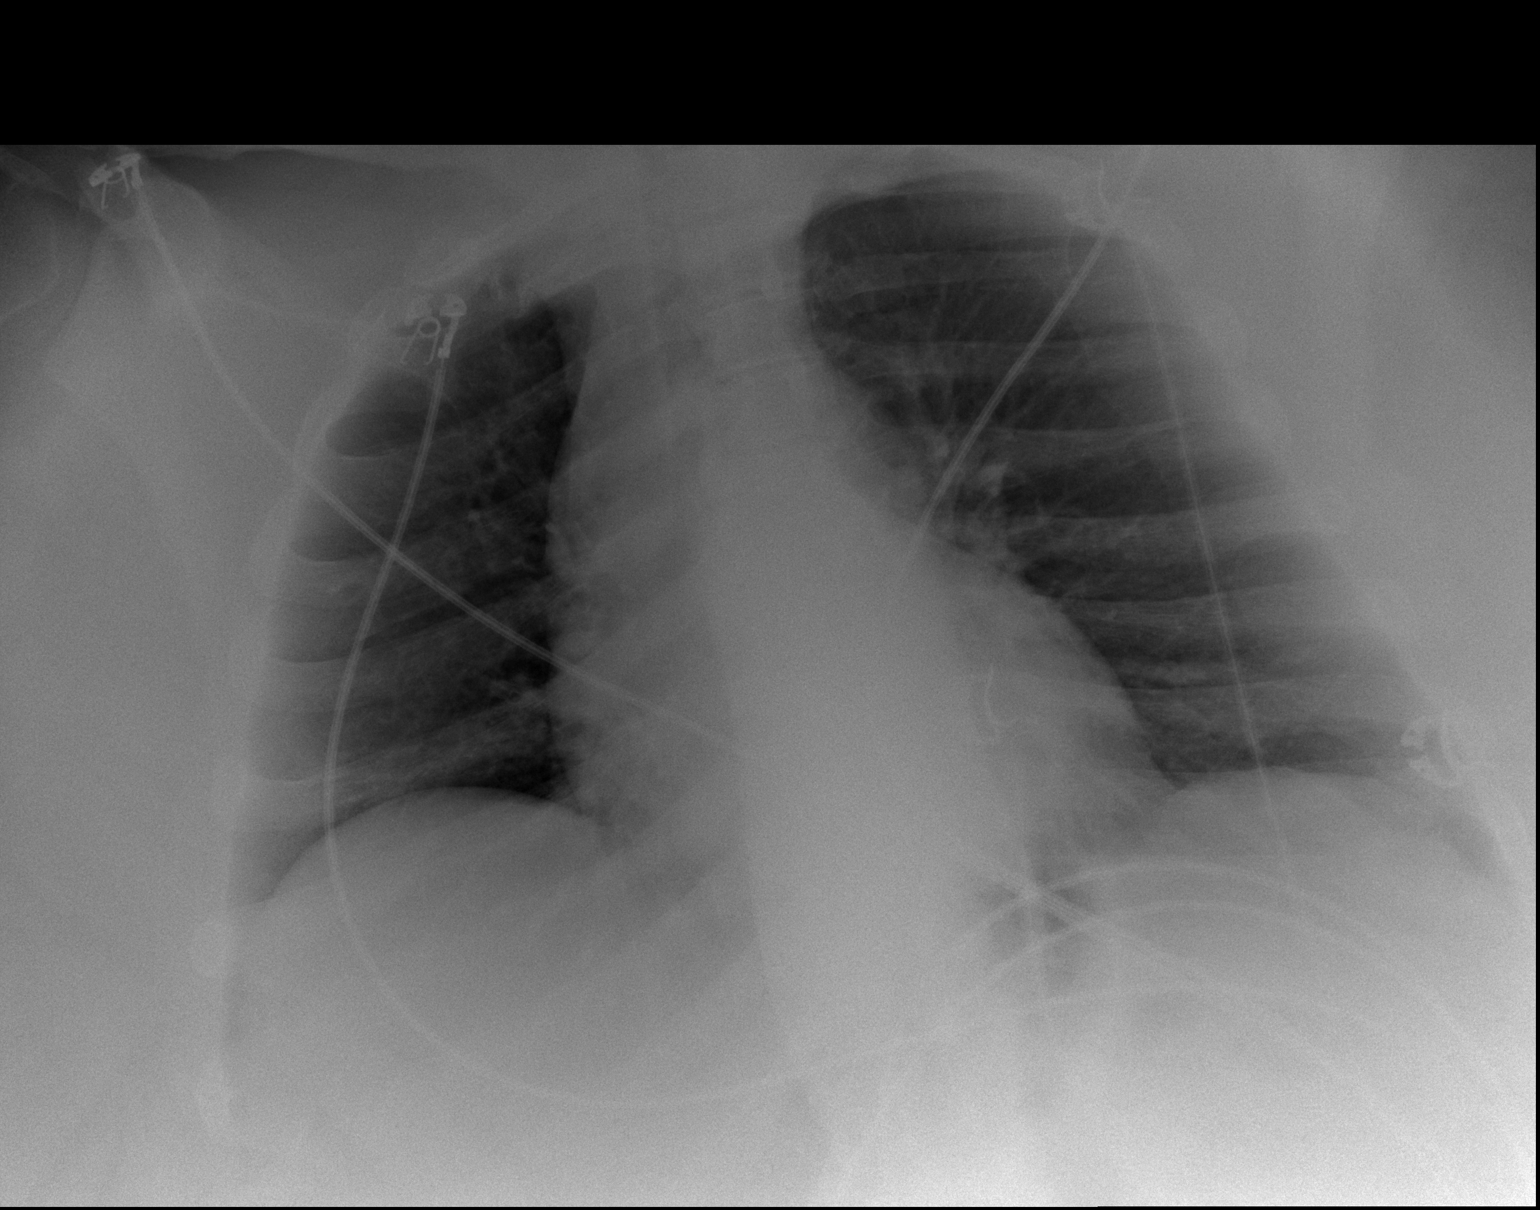

[2 of 2 positions shown; findings below may reference images not displayed]

FINDINGS: Normal heart size and mediastinal contours for lordotic semi erect
AP technique.

Pulmonary vascularity normal.

Lungs clear.

No infiltrate, pleural effusion, or pneumothorax.

Degenerative disc disease changes thoracic spine.
IMPRESSION: No acute abnormalities.
